# Patient Record
Sex: Male | Born: 1960 | Race: White | Hispanic: No | Marital: Married | Smoking: Never smoker
Health system: Southern US, Community
[De-identification: ages and names within clinical notes are randomized; demographics above are authoritative.]

## PROBLEM LIST (undated history)

## (undated) DIAGNOSIS — J189 Pneumonia, unspecified organism: Secondary | ICD-10-CM

## (undated) DIAGNOSIS — Z789 Other specified health status: Secondary | ICD-10-CM

## (undated) HISTORY — PX: COLONOSCOPY: SHX174

---

## 2019-07-26 ENCOUNTER — Other Ambulatory Visit: Payer: Self-pay

## 2019-07-26 ENCOUNTER — Inpatient Hospital Stay (HOSPITAL_COMMUNITY)
Admission: EM | Admit: 2019-07-26 | Discharge: 2019-07-30 | DRG: 470 | Disposition: A | Discharge status: Home Health | Payer: BC Managed Care – PPO | Attending: General Surgery | Admitting: General Surgery

## 2019-07-26 ENCOUNTER — Emergency Department (HOSPITAL_COMMUNITY): Payer: BC Managed Care – PPO

## 2019-07-26 DIAGNOSIS — Z23 Encounter for immunization: Secondary | ICD-10-CM

## 2019-07-26 DIAGNOSIS — E2749 Other adrenocortical insufficiency: Secondary | ICD-10-CM | POA: Diagnosis present

## 2019-07-26 DIAGNOSIS — S72002A Fracture of unspecified part of neck of left femur, initial encounter for closed fracture: Secondary | ICD-10-CM

## 2019-07-26 DIAGNOSIS — S42441A Displaced fracture (avulsion) of medial epicondyle of right humerus, initial encounter for closed fracture: Secondary | ICD-10-CM | POA: Diagnosis present

## 2019-07-26 DIAGNOSIS — S32000A Wedge compression fracture of unspecified lumbar vertebra, initial encounter for closed fracture: Secondary | ICD-10-CM

## 2019-07-26 DIAGNOSIS — J90 Pleural effusion, not elsewhere classified: Secondary | ICD-10-CM

## 2019-07-26 DIAGNOSIS — W14XXXA Fall from tree, initial encounter: Secondary | ICD-10-CM

## 2019-07-26 DIAGNOSIS — Z20828 Contact with and (suspected) exposure to other viral communicable diseases: Secondary | ICD-10-CM | POA: Diagnosis present

## 2019-07-26 DIAGNOSIS — Z96642 Presence of left artificial hip joint: Secondary | ICD-10-CM

## 2019-07-26 DIAGNOSIS — S32029A Unspecified fracture of second lumbar vertebra, initial encounter for closed fracture: Secondary | ICD-10-CM | POA: Diagnosis present

## 2019-07-26 DIAGNOSIS — S32019A Unspecified fracture of first lumbar vertebra, initial encounter for closed fracture: Secondary | ICD-10-CM | POA: Diagnosis present

## 2019-07-26 DIAGNOSIS — S32039A Unspecified fracture of third lumbar vertebra, initial encounter for closed fracture: Secondary | ICD-10-CM | POA: Diagnosis present

## 2019-07-26 DIAGNOSIS — S52041A Displaced fracture of coronoid process of right ulna, initial encounter for closed fracture: Secondary | ICD-10-CM | POA: Diagnosis present

## 2019-07-26 DIAGNOSIS — S53104A Unspecified dislocation of right ulnohumeral joint, initial encounter: Secondary | ICD-10-CM

## 2019-07-26 DIAGNOSIS — S52502A Unspecified fracture of the lower end of left radius, initial encounter for closed fracture: Secondary | ICD-10-CM | POA: Diagnosis present

## 2019-07-26 DIAGNOSIS — S2231XA Fracture of one rib, right side, initial encounter for closed fracture: Secondary | ICD-10-CM | POA: Diagnosis present

## 2019-07-26 DIAGNOSIS — S63005A Unspecified dislocation of left wrist and hand, initial encounter: Secondary | ICD-10-CM | POA: Diagnosis present

## 2019-07-26 DIAGNOSIS — S42401A Unspecified fracture of lower end of right humerus, initial encounter for closed fracture: Secondary | ICD-10-CM

## 2019-07-26 DIAGNOSIS — S32009A Unspecified fracture of unspecified lumbar vertebra, initial encounter for closed fracture: Secondary | ICD-10-CM | POA: Insufficient documentation

## 2019-07-26 DIAGNOSIS — W19XXXA Unspecified fall, initial encounter: Secondary | ICD-10-CM | POA: Diagnosis present

## 2019-07-26 DIAGNOSIS — S63095A Other dislocation of left wrist and hand, initial encounter: Secondary | ICD-10-CM

## 2019-07-26 DIAGNOSIS — T148XXA Other injury of unspecified body region, initial encounter: Secondary | ICD-10-CM

## 2019-07-26 HISTORY — DX: Other specified health status: Z78.9

## 2019-07-26 LAB — CBC
HCT: 48.6 % (ref 39.0–52.0)
Hemoglobin: 16.4 g/dL (ref 13.0–17.0)
MCH: 29 pg (ref 26.0–34.0)
MCHC: 33.7 g/dL (ref 30.0–36.0)
MCV: 86 fL (ref 80.0–100.0)
Platelets: 226 10*3/uL (ref 150–400)
RBC: 5.65 MIL/uL (ref 4.22–5.81)
RDW: 12.7 % (ref 11.5–15.5)
WBC: 21 10*3/uL — ABNORMAL HIGH (ref 4.0–10.5)
nRBC: 0 % (ref 0.0–0.2)

## 2019-07-26 LAB — COMPREHENSIVE METABOLIC PANEL
ALT: 111 U/L — ABNORMAL HIGH (ref 0–44)
AST: 106 U/L — ABNORMAL HIGH (ref 15–41)
Albumin: 3.9 g/dL (ref 3.5–5.0)
Alkaline Phosphatase: 56 U/L (ref 38–126)
Anion gap: 16 — ABNORMAL HIGH (ref 5–15)
BUN: 19 mg/dL (ref 6–20)
CO2: 15 mmol/L — ABNORMAL LOW (ref 22–32)
Calcium: 8.9 mg/dL (ref 8.9–10.3)
Chloride: 106 mmol/L (ref 98–111)
Creatinine, Ser: 1.26 mg/dL — ABNORMAL HIGH (ref 0.61–1.24)
GFR calc Af Amer: >60 mL/min (ref >60)
GFR calc non Af Amer: >60 mL/min (ref >60)
Glucose, Bld: 191 mg/dL — ABNORMAL HIGH (ref 70–99)
Potassium: 2.8 mmol/L — ABNORMAL LOW (ref 3.5–5.1)
Sodium: 137 mmol/L (ref 135–145)
Total Bilirubin: 0.7 mg/dL (ref 0.3–1.2)
Total Protein: 6.1 g/dL — ABNORMAL LOW (ref 6.5–8.1)

## 2019-07-26 LAB — I-STAT CHEM 8, ED
BUN: 19 mg/dL (ref 6–20)
Calcium, Ion: 1.17 mmol/L (ref 1.15–1.40)
Chloride: 108 mmol/L (ref 98–111)
Creatinine, Ser: 1.1 mg/dL (ref 0.61–1.24)
Glucose, Bld: 186 mg/dL — ABNORMAL HIGH (ref 70–99)
HCT: 47 % (ref 39.0–52.0)
Hemoglobin: 16 g/dL (ref 13.0–17.0)
Potassium: 2.9 mmol/L — ABNORMAL LOW (ref 3.5–5.1)
Sodium: 141 mmol/L (ref 135–145)
TCO2: 17 mmol/L — ABNORMAL LOW (ref 22–32)

## 2019-07-26 LAB — SAMPLE TO BLOOD BANK

## 2019-07-26 LAB — PROTIME-INR
INR: 1.1 (ref 0.8–1.2)
Prothrombin Time: 13.7 seconds (ref 11.4–15.2)

## 2019-07-26 LAB — SARS CORONAVIRUS 2 BY RT PCR (HOSPITAL ORDER, PERFORMED IN ~~LOC~~ HOSPITAL LAB): SARS Coronavirus 2: NEGATIVE

## 2019-07-26 LAB — ETHANOL: Alcohol, Ethyl (B): <10 mg/dL (ref <10)

## 2019-07-26 LAB — LACTIC ACID, PLASMA: Lactic Acid, Venous: 5.1 mmol/L (ref 0.5–1.9)

## 2019-07-26 LAB — CDS SEROLOGY

## 2019-07-26 MED ORDER — HYDROMORPHONE HCL 1 MG/ML IJ SOLN: 1.0000 mg | INTRAMUSCULAR | Status: DC | PRN

## 2019-07-26 MED ORDER — TETANUS-DIPHTH-ACELL PERTUSSIS 5-2.5-18.5 LF-MCG/0.5 IM SUSP
0.5000 mL | Freq: Once | INTRAMUSCULAR | Status: AC
  Administered 2019-07-26: 0.5 mL via INTRAMUSCULAR
  Filled 2019-07-26: qty 0.5

## 2019-07-26 MED ORDER — POTASSIUM CHLORIDE 10 MEQ/100ML IV SOLN
10.0000 meq | Freq: Once | INTRAVENOUS | Status: AC
  Administered 2019-07-26: 22:00:00 10 meq via INTRAVENOUS
  Filled 2019-07-26: qty 100

## 2019-07-26 MED ORDER — PROPOFOL 10 MG/ML IV BOLUS
200.0000 mg | Freq: Once | INTRAVENOUS | Status: AC
  Administered 2019-07-26: 160 mg via INTRAVENOUS
  Filled 2019-07-26: qty 20

## 2019-07-26 MED ORDER — ONDANSETRON 4 MG PO TBDP: 4.0000 mg | ORAL_TABLET | Freq: Four times a day (QID) | ORAL | Status: DC | PRN

## 2019-07-26 MED ORDER — PANTOPRAZOLE SODIUM 40 MG IV SOLR: 40.0000 mg | Freq: Every day | INTRAVENOUS | Status: DC

## 2019-07-26 MED ORDER — HYDROMORPHONE HCL 1 MG/ML IJ SOLN
1.0000 mg | Freq: Once | INTRAMUSCULAR | Status: AC
  Administered 2019-07-26: 22:00:00 1 mg via INTRAVENOUS
  Filled 2019-07-26: qty 1

## 2019-07-26 MED ORDER — PANTOPRAZOLE SODIUM 40 MG PO TBEC
40.0000 mg | DELAYED_RELEASE_TABLET | Freq: Every day | ORAL | Status: DC
  Administered 2019-07-27 – 2019-07-30 (×4): 40 mg via ORAL
  Filled 2019-07-26 (×4): qty 1

## 2019-07-26 MED ORDER — LIDOCAINE HCL 2 % IJ SOLN
10.0000 mL | Freq: Once | INTRAMUSCULAR | Status: DC
  Filled 2019-07-26: qty 20

## 2019-07-26 MED ORDER — SODIUM CHLORIDE 0.9 % IV BOLUS
1000.0000 mL | Freq: Once | INTRAVENOUS | Status: AC
  Administered 2019-07-26: 1000 mL via INTRAVENOUS

## 2019-07-26 MED ORDER — HYDROMORPHONE HCL 1 MG/ML IJ SOLN
1.0000 mg | Freq: Once | INTRAMUSCULAR | Status: AC
  Administered 2019-07-26: 1 mg via INTRAVENOUS
  Filled 2019-07-26: qty 1

## 2019-07-26 MED ORDER — MORPHINE SULFATE (PF) 4 MG/ML IV SOLN
4.0000 mg | Freq: Once | INTRAVENOUS | Status: AC
  Administered 2019-07-26: 4 mg via INTRAVENOUS
  Filled 2019-07-26: qty 1

## 2019-07-26 MED ORDER — ENOXAPARIN SODIUM 40 MG/0.4ML ~~LOC~~ SOLN
40.0000 mg | SUBCUTANEOUS | Status: AC
  Administered 2019-07-27: 40 mg via SUBCUTANEOUS
  Filled 2019-07-26: qty 0.4

## 2019-07-26 MED ORDER — SODIUM CHLORIDE 0.9 % IV BOLUS
1000.0000 mL | Freq: Once | INTRAVENOUS | Status: AC
  Administered 2019-07-26: 20:00:00 1000 mL via INTRAVENOUS

## 2019-07-26 MED ORDER — PROPOFOL 10 MG/ML IV BOLUS
INTRAVENOUS | Status: AC | PRN
  Administered 2019-07-26 (×3): 30 ug via INTRAVENOUS
  Administered 2019-07-26: 50 ug via INTRAVENOUS
  Administered 2019-07-26: 20 ug via INTRAVENOUS

## 2019-07-26 MED ORDER — ONDANSETRON HCL 4 MG/2ML IJ SOLN
4.0000 mg | Freq: Once | INTRAMUSCULAR | Status: AC
  Administered 2019-07-26: 4 mg via INTRAVENOUS
  Filled 2019-07-26: qty 2

## 2019-07-26 MED ORDER — OXYCODONE HCL 5 MG PO TABS: 10.0000 mg | ORAL_TABLET | ORAL | Status: DC | PRN

## 2019-07-26 MED ORDER — ONDANSETRON HCL 4 MG/2ML IJ SOLN: 4.0000 mg | Freq: Four times a day (QID) | INTRAMUSCULAR | Status: DC | PRN

## 2019-07-26 MED ORDER — IOHEXOL 300 MG/ML  SOLN
100.0000 mL | Freq: Once | INTRAMUSCULAR | Status: AC | PRN
  Administered 2019-07-26: 100 mL via INTRAVENOUS

## 2019-07-26 NOTE — ED Triage Notes (Addendum)
Patient brought in by [facility] due falling out of tree. Patient was in tree cutting a branch when ladder fell from under him and he fell 87ft out of tree. Denies LOC. Wife witnessed fall and call EMS  Obvious deformity to L. Wrist, R. Elbow, shortening and deformity to LLE,   Pain to the upper back and R. Shoulder. Rating pain a 10/10  IV X2 Left AC. 18gauges  vss

## 2019-07-26 NOTE — Sedation Documentation (Signed)
Unable to q5 minute vitals due patient having right shoulder reduction and left wrist reduction

## 2019-07-26 NOTE — Sedation Documentation (Addendum)
Right Shoulder reduced by Dr. Doreatha Martin and Patrecia Pace, PA.  Splint placed on right arm by ortho tech.

## 2019-07-26 NOTE — ED Notes (Signed)
Pt in x-ray at this time

## 2019-07-26 NOTE — H&P (Signed)
History   Robert Hahn is an 58 y.o. male.   Chief Complaint:  Chief Complaint  Patient presents with   Fall    [facility]   Trauma    Patient is a 58 year old male with no past medical history, who comes in after falling from a ladder, 20 feet.  According to the patient's daughter he was cutting a limb when the limb fell and hit the ladder.  Patient landed on his hands.  Patient denies any LOC.  Patient was coded as a level 2 trauma on intake. Patient underwent ATLS work-up.  Patient was found to have right elbow dislocation, left wrist dislocation, lumbar fractures, adrenal hemorrhage, and right hip fracture.  Trauma surgery was consulted for admission.  Orthopedic surgery was consulted for fractures dislocations.  Neurosurgery was consulted for lumbar fractures.  Urology was consulted for adrenal hemorrhage.   No past medical history on file.   No family history on file. Social History:  has no history on file for tobacco, alcohol, and drug.  Allergies  No Known Allergies  Home Medications  (Not in a hospital admission)   Trauma Course   Results for orders placed or performed during the hospital encounter of 07/26/19 (from the past 48 hour(s))  CDS serology     Status: None   Collection Time: 07/26/19  8:01 PM  Result Value Ref Range   CDS serology specimen      SPECIMEN WILL BE HELD FOR 14 DAYS IF TESTING IS REQUIRED    Comment: Performed at [facility], [address] [place], [place] [address]  Comprehensive metabolic panel     Status: Abnormal   Collection Time: 07/26/19  8:01 PM  Result Value Ref Range   Sodium 137 135 - 145 mmol/L   Potassium 2.8 (L) 3.5 - 5.1 mmol/L   Chloride 106 98 - 111 mmol/L   CO2 15 (L) 22 - 32 mmol/L   Glucose, Bld 191 (H) 70 - 99 mg/dL   BUN 19 6 - 20 mg/dL   Creatinine, Ser 6.041.26 (H) 0.61 - 1.24 mg/dL   Calcium 8.9 8.9 - 54.010.3 mg/dL   Total Protein 6.1 (L) 6.5 - 8.1 g/dL   Albumin 3.9 3.5 - 5.0 g/dL   AST  981106 (H) 15 - 41 U/L   ALT 111 (H) 0 - 44 U/L   Alkaline Phosphatase 56 38 - 126 U/L   Total Bilirubin 0.7 0.3 - 1.2 mg/dL   GFR calc non Af Amer >60 >60 mL/min   GFR calc Af Amer >60 >60 mL/min   Anion gap 16 (H) 5 - 15    Comment: Performed at [facility], [address] [place], [place] [address]  CBC     Status: Abnormal   Collection Time: 07/26/19  8:01 PM  Result Value Ref Range   WBC 21.0 (H) 4.0 - 10.5 K/uL   RBC 5.65 4.22 - 5.81 MIL/uL   Hemoglobin 16.4 13.0 - 17.0 g/dL   HCT 82.948.6 56.239.0 - 13.052.0 %   MCV 86.0 80.0 - 100.0 fL   MCH 29.0 26.0 - 34.0 pg   MCHC 33.7 30.0 - 36.0 g/dL   RDW 86.512.7 78.411.5 - 69.615.5 %   Platelets 226 150 - 400 K/uL   nRBC 0.0 0.0 - 0.2 %    Comment: Performed at [facility], [address] [place], [place] [address]  Ethanol     Status: None   Collection  Time: 07/26/19  8:01 PM  Result Value Ref Range   Alcohol, Ethyl (B) <10 <10 mg/dL    Comment: (NOTE) Lowest detectable limit for serum alcohol is 10 mg/dL. For medical purposes only. Performed at [facility], [address] [place], [place] [address]   Lactic acid, plasma     Status: Abnormal   Collection Time: 07/26/19  8:01 PM  Result Value Ref Range   Lactic Acid, Venous 5.1 (HH) 0.5 - 1.9 mmol/L    Comment: CRITICAL RESULT CALLED TO, READ BACK BY AND VERIFIED WITH: Marissa Nestle RN @ 2044 ON 07/26/2019 BY TEMOCHE, H Performed at [facility], [address] [place], [place] [address]   Protime-INR     Status: None   Collection Time: 07/26/19  8:01 PM  Result Value Ref Range   Prothrombin Time 13.7 11.4 - 15.2 seconds   INR 1.1 0.8 - 1.2    Comment: (NOTE) INR goal varies based on device and disease states. Performed at [facility], [address] [place], [place] [address]   Sample to Blood Bank     Status: None   Collection Time: 07/26/19  8:01 PM  Result Value Ref Range   Blood Bank Specimen SAMPLE AVAILABLE FOR TESTING    Sample Expiration       07/27/2019,2359 Performed at [facility], [address], [place], [place] [address]   I-stat chem 8, ED     Status: Abnormal   Collection Time: 07/26/19  8:07 PM  Result Value Ref Range   Sodium 141 135 - 145 mmol/L   Potassium 2.9 (L) 3.5 - 5.1 mmol/L   Chloride 108 98 - 111 mmol/L   BUN 19 6 - 20 mg/dL   Creatinine, Ser 5.28 0.61 - 1.24 mg/dL   Glucose, Bld 413 (H) 70 - 99 mg/dL   Calcium, Ion 2.44 0.10 - 1.40 mmol/L   TCO2 17 (L) 22 - 32 mmol/L   Hemoglobin 16.0 13.0 - 17.0 g/dL   HCT 27.2 53.6 - 64.4 %  SARS Coronavirus 2 [facility] order, Performed in [facility] lab) Nasopharyngeal Nasopharyngeal Swab     Status: None   Collection Time: 07/26/19  8:12 PM   Specimen: Nasopharyngeal Swab  Result Value Ref Range   SARS Coronavirus 2 NEGATIVE NEGATIVE    Comment: (NOTE) If result is NEGATIVE SARS-CoV-2 target nucleic acids are NOT DETECTED. The SARS-CoV-2 RNA is generally detectable in upper and lower  respiratory specimens during the acute phase of infection. The lowest  concentration of SARS-CoV-2 viral copies this assay can detect is 250  copies / mL. A negative result does not preclude SARS-CoV-2 infection  and should not be used as the sole basis for treatment or other  patient management decisions.  A negative result may occur with  improper specimen collection / handling, submission of specimen other  than nasopharyngeal swab, presence of viral mutation(s) within the  areas targeted by this assay, and inadequate number of viral copies  (<250 copies / mL). A negative result must be combined with clinical  observations, patient history, and epidemiological information. If result is POSITIVE SARS-CoV-2 target nucleic acids are DETECTED. The SARS-CoV-2 RNA is generally detectable in upper and lower  respiratory specimens dur ing the acute phase of infection.  Positive  results are indicative of active infection with SARS-CoV-2.  Clinical  correlation with  patient history and other diagnostic information is  necessary to determine patient infection  status.  Positive results do  not rule out bacterial infection or co-infection with other viruses. If result is PRESUMPTIVE POSTIVE SARS-CoV-2 nucleic acids MAY BE PRESENT.   A presumptive positive result was obtained on the submitted specimen  and confirmed on repeat testing.  While 2019 novel coronavirus  (SARS-CoV-2) nucleic acids may be present in the submitted sample  additional confirmatory testing may be necessary for epidemiological  and / or clinical management purposes  to differentiate between  SARS-CoV-2 and other Sarbecovirus currently known to infect humans.  If clinically indicated additional testing with an alternate test  methodology [phone]) is advised. The SARS-CoV-2 RNA is generally  detectable in upper and lower respiratory sp ecimens during the acute  phase of infection. The expected result is Negative. Fact Sheet for Patients:  BoilerBrush.com.cy Fact Sheet for Healthcare Providers: https://pope.com/ This test is not yet approved or cleared by the [place] FDA and has been authorized for detection and/or diagnosis of SARS-CoV-2 by FDA under an Emergency Use Authorization (EUA).  This EUA will remain in effect (meaning this test can be used) for the duration of the COVID-19 declaration under Section 564(b)(1) of the Act, 21 U.S.C. section 360bbb-3(b)(1), unless the authorization is terminated or revoked sooner. Performed at [facility], [address], [place], [place] [address]    Dg Shoulder Right  Result Date: 07/26/2019 CLINICAL DATA:  Right shoulder pain EXAM: RIGHT SHOULDER - 2+ VIEW COMPARISON:  None. FINDINGS: There is no evidence of fracture or dislocation. Visualized portion of the lung is clear. Soft tissues are unremarkable. IMPRESSION: No acute osseous abnormality. Electronically Signed   By: Jonna Clark M.D.   On: 07/26/2019 21:56   Dg Elbow Complete Right  Result Date: 07/26/2019 CLINICAL DATA:  Pain after fall EXAM: RIGHT ELBOW - COMPLETE 3+ VIEW COMPARISON:  None. FINDINGS: There is a 1.1 cm osseous fragment seen adjacent to the medial epicondyle, likely fracture from the medial epicondyle. There is complete posterior dislocation of the radius and ulna at the elbow. There is also a tiny osseous fragment seen adjacent to the capitellar surface of indeterminate origin. Significant surrounding soft tissue swelling and elbow joint effusion is seen. IMPRESSION: Osseous fragment adjacent to the medial epicondyle, likely medial epicondylar fracture. Complete posterior dislocation at the elbow. The osseous fragment seen adjacent to the capitellum of indeterminate origin. Significant surrounding soft tissue swelling and elbow joint effusion. Electronically Signed   By: Jonna Clark M.D.   On: 07/26/2019 21:59   Dg Wrist Complete Left  Result Date: 07/26/2019 CLINICAL DATA:  Fall EXAM: LEFT WRIST - COMPLETE 3+ VIEW COMPARISON:  None. FINDINGS: There is a complete anterior volar dislocation of the lunate. There is superior migration of the radius with volar angulation. There is comminuted fracture seen of the distal radius and ulnar styloid with small fracture fragments seen along the radial surface. There is ulnar subluxation of the distal ulna. Significant surrounding soft tissue swelling is seen. There is a mild metallic foreign body seen overlying the fifth metacarpal head. IMPRESSION: Complete anterior volar dislocation of the lunate. Comminuted fracture of the distal radius with superior impaction of the distal radius. Electronically Signed   By: Jonna Clark M.D.   On: 07/26/2019 21:55   Ct Head Wo Contrast  Result Date: 07/26/2019 CLINICAL DATA:  Fall from tree. EXAM: CT HEAD WITHOUT CONTRAST CT CERVICAL SPINE WITHOUT CONTRAST TECHNIQUE: Multidetector CT imaging of the head and cervical spine was  performed following the standard  protocol without intravenous contrast. Multiplanar CT image reconstructions of the cervical spine were also generated. COMPARISON:  None. FINDINGS: CT HEAD FINDINGS Brain: No acute intracranial abnormality. Specifically, no hemorrhage, hydrocephalus, mass lesion, acute infarction, or significant intracranial injury. Vascular: No hyperdense vessel or unexpected calcification. Skull: No acute calvarial abnormality. Sinuses/Orbits: Visualized paranasal sinuses and mastoids clear. Orbital soft tissues unremarkable. Other: None CT CERVICAL SPINE FINDINGS Alignment: Normal Skull base and vertebrae: No acute fracture. No primary bone lesion or focal pathologic process. Soft tissues and spinal canal: No prevertebral fluid or swelling. No visible canal hematoma. Disc levels: Degenerative disc disease at C4-5 with disc space narrowing and spurring. Upper chest: Posterior right 1st rib fracture noted. There appears to be a right effusion partially imaged. See chest CT report for full discussion. Other: None IMPRESSION: No acute intracranial abnormality. Fracture through the posterior right first rib. Probable right pleural effusion. No acute bony abnormality in the cervical spine. Electronically Signed   By: Charlett NoseKevin  Dover M.D.   On: 07/26/2019 21:05   Ct Chest W Contrast  Addendum Date: 07/26/2019   ADDENDUM REPORT: 07/26/2019 21:27 ADDENDUM: A minimally displaced proximal right first rib fracture is present. No additional rib fractures are present. Electronically Signed   By: Marin Robertshristopher  Mattern M.D.   On: 07/26/2019 21:27   Result Date: 07/26/2019 CLINICAL DATA:  Abd trauma, blunt, stable. Hit by tree branch. Fell from tree. EXAM: CT CHEST, ABDOMEN, AND PELVIS WITH CONTRAST TECHNIQUE: Multidetector CT imaging of the chest, abdomen and pelvis was performed following the standard protocol during bolus administration of intravenous contrast. CONTRAST:  100mL OMNIPAQUE IOHEXOL 300 MG/ML  SOLN  COMPARISON:  None. FINDINGS: CT CHEST FINDINGS Cardiovascular: Heart size is normal. In aorta and great vessel origins are within normal limits. Pulmonary arteries are normal. There is no significant pericardial effusion or trauma. Mediastinum/Nodes: Significant mediastinal, hilar, or axillary adenopathy is present. There is no significant mediastinal effusion or air. Lungs/Pleura: Minimal dependent atelectasis is present. Lungs are otherwise clear without focal nodule, mass, or airspace disease. Is no pulmonary contusion. No pneumothorax is present. No significant pleural effusions are present. Musculoskeletal: 12 rib-bearing thoracic type vertebral bodies are present. No acute or healing fractures are present. Ribs are unremarkable. CT ABDOMEN PELVIS FINDINGS Hepatobiliary: Hemorrhage is present along the posterior margin of the liver. No focal hepatic contusion is present. The common bile duct and gallbladder normal. Pancreas: Unremarkable. No pancreatic ductal dilatation or surrounding inflammatory changes. Spleen: No splenic injury or perisplenic hematoma. Adrenals/Urinary Tract: There is diffuse hemorrhage about the right adrenal gland. Focal collection is present. Left adrenal gland is normal. Simple cyst in the left kidney measures 5.3 x 4.1 x 4.3 cm. Kidneys and ureters are otherwise within normal limits bilaterally. The urinary bladder is within normal limits. Stomach/Bowel: Stomach and duodenum are within normal limits. Small bowel is unremarkable. Terminal ileum is normal. The appendix is visualized and normal. The ascending and transverse colon are normal. Descending and sigmoid colon are within normal limits. Vascular/Lymphatic: Scattered atherosclerotic calcifications are present. There is no aneurysm. No significant retroperitoneal adenopathy is present. Reproductive: Prostate is unremarkable. Other: No abdominal wall hernia or abnormality. No abdominopelvic ascites. Musculoskeletal: Left transverse  process fractures are present at L1, L2, and L3. Fracture fragments are displaced posteriorly 1 shaft width. Vertebral body heights are maintained. No additional fractures present in the spine. Sacrum is intact. A comminuted left femoral neck fracture is present. Varus angulation is noted. Right hip is located. Left acetabulum is intact.  IMPRESSION: 1. Right adrenal hemorrhage. 2. Hemorrhage extending into Morrison's pouch and about the free edge of the liver posteriorly. No definite hemorrhagic contusion is present. 3. Left L1, L2, and L3 transverse process fractures 4. Comminuted left femoral neck fracture. Electronically Signed: By: Marin Roberts M.D. On: 07/26/2019 21:18   Ct Cervical Spine Wo Contrast  Result Date: 07/26/2019 CLINICAL DATA:  Fall from tree. EXAM: CT HEAD WITHOUT CONTRAST CT CERVICAL SPINE WITHOUT CONTRAST TECHNIQUE: Multidetector CT imaging of the head and cervical spine was performed following the standard protocol without intravenous contrast. Multiplanar CT image reconstructions of the cervical spine were also generated. COMPARISON:  None. FINDINGS: CT HEAD FINDINGS Brain: No acute intracranial abnormality. Specifically, no hemorrhage, hydrocephalus, mass lesion, acute infarction, or significant intracranial injury. Vascular: No hyperdense vessel or unexpected calcification. Skull: No acute calvarial abnormality. Sinuses/Orbits: Visualized paranasal sinuses and mastoids clear. Orbital soft tissues unremarkable. Other: None CT CERVICAL SPINE FINDINGS Alignment: Normal Skull base and vertebrae: No acute fracture. No primary bone lesion or focal pathologic process. Soft tissues and spinal canal: No prevertebral fluid or swelling. No visible canal hematoma. Disc levels: Degenerative disc disease at C4-5 with disc space narrowing and spurring. Upper chest: Posterior right 1st rib fracture noted. There appears to be a right effusion partially imaged. See chest CT report for full  discussion. Other: None IMPRESSION: No acute intracranial abnormality. Fracture through the posterior right first rib. Probable right pleural effusion. No acute bony abnormality in the cervical spine. Electronically Signed   By: Charlett Nose M.D.   On: 07/26/2019 21:05   Ct Abdomen Pelvis W Contrast  Addendum Date: 07/26/2019   ADDENDUM REPORT: 07/26/2019 21:27 ADDENDUM: A minimally displaced proximal right first rib fracture is present. No additional rib fractures are present. Electronically Signed   By: Marin Roberts M.D.   On: 07/26/2019 21:27   Result Date: 07/26/2019 CLINICAL DATA:  Abd trauma, blunt, stable. Hit by tree branch. Fell from tree. EXAM: CT CHEST, ABDOMEN, AND PELVIS WITH CONTRAST TECHNIQUE: Multidetector CT imaging of the chest, abdomen and pelvis was performed following the standard protocol during bolus administration of intravenous contrast. CONTRAST:  OMNIPAQUE IOHEXOL 300 MG/ML  SOLN COMPARISON:  None. FINDINGS: CT CHEST FINDINGS Cardiovascular: Heart size is normal. In aorta and great vessel origins are within normal limits. Pulmonary arteries are normal. There is no significant pericardial effusion or trauma. Mediastinum/Nodes: Significant mediastinal, hilar, or axillary adenopathy is present. There is no significant mediastinal effusion or air. Lungs/Pleura: Minimal dependent atelectasis is present. Lungs are otherwise clear without focal nodule, mass, or airspace disease. Is no pulmonary contusion. No pneumothorax is present. No significant pleural effusions are present. Musculoskeletal: 12 rib-bearing thoracic type vertebral bodies are present. No acute or healing fractures are present. Ribs are unremarkable. CT ABDOMEN PELVIS FINDINGS Hepatobiliary: Hemorrhage is present along the posterior margin of the liver. No focal hepatic contusion is present. The common bile duct and gallbladder normal. Pancreas: Unremarkable. No pancreatic ductal dilatation or surrounding  inflammatory changes. Spleen: No splenic injury or perisplenic hematoma. Adrenals/Urinary Tract: There is diffuse hemorrhage about the right adrenal gland. Focal collection is present. Left adrenal gland is normal. Simple cyst in the left kidney measures 5.3 x 4.1 x 4.3 cm. Kidneys and ureters are otherwise within normal limits bilaterally. The urinary bladder is within normal limits. Stomach/Bowel: Stomach and duodenum are within normal limits. Small bowel is unremarkable. Terminal ileum is normal. The appendix is visualized and normal. The ascending and transverse colon are  normal. Descending and sigmoid colon are within normal limits. Vascular/Lymphatic: Scattered atherosclerotic calcifications are present. There is no aneurysm. No significant retroperitoneal adenopathy is present. Reproductive: Prostate is unremarkable. Other: No abdominal wall hernia or abnormality. No abdominopelvic ascites. Musculoskeletal: Left transverse process fractures are present at L1, L2, and L3. Fracture fragments are displaced posteriorly 1 shaft width. Vertebral body heights are maintained. No additional fractures present in the spine. Sacrum is intact. A comminuted left femoral neck fracture is present. Varus angulation is noted. Right hip is located. Left acetabulum is intact. IMPRESSION: 1. Right adrenal hemorrhage. 2. Hemorrhage extending into Morrison's pouch and about the free edge of the liver posteriorly. No definite hemorrhagic contusion is present. 3. Left L1, L2, and L3 transverse process fractures 4. Comminuted left femoral neck fracture. Electronically Signed: By: Marin Roberts M.D. On: 07/26/2019 21:18   Dg Chest Port 1 View  Result Date: 07/26/2019 CLINICAL DATA:  Fall out of tree. EXAM: PORTABLE CHEST 1 VIEW COMPARISON:  None. FINDINGS: The heart size and mediastinal contours are within normal limits. Both lungs are clear. The visualized skeletal structures are unremarkable. IMPRESSION: No active disease.  Electronically Signed   By: Charlett Nose M.D.   On: 07/26/2019 20:42   Dg Knee Complete 4 Views Left  Result Date: 07/26/2019 CLINICAL DATA:  Knee pain after fall EXAM: LEFT KNEE - COMPLETE 4+ VIEW COMPARISON:  None. FINDINGS: No fracture or dislocation. Small knee joint effusion is seen. Soft tissue swelling seen around the medial aspect of the knee. The linear calcifications seen in the medial compartment of the knee. IMPRESSION: No acute osseous abnormality. Soft tissue swelling seen around the medial knee with a small knee joint effusion. 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 Electronically Signed   By: Jonna Clark M.D.   On: 07/26/2019 21:57   Dg Hip Port Unilat With Pelvis 1v Left  Result Date: 07/26/2019 CLINICAL DATA:  Left hip pain following a fall out of a tree. EXAM: DG HIP (WITH OR WITHOUT PELVIS) 1V PORT LEFT COMPARISON:  None. FINDINGS: Left femoral neck fracture with mild proximal displacement of the distal fragment and mild varus angulation. No other fractures seen. IMPRESSION: Left femoral neck fracture, as described above. Electronically Signed   By: Beckie Salts M.D.   On: 07/26/2019 20:41    Review of Systems  Constitutional: Negative for chills, fever, malaise/fatigue and weight loss.  HENT: Negative for ear discharge, ear pain, hearing loss, sore throat and tinnitus.   Eyes: Negative for blurred vision, double vision, photophobia, pain and discharge.  Respiratory: Negative for cough, sputum production and shortness of breath.   Cardiovascular: Negative for chest pain, orthopnea and leg swelling.  Gastrointestinal: Negative for abdominal pain, constipation, diarrhea, heartburn, nausea and vomiting.  Genitourinary: Negative for dysuria, flank pain, frequency and urgency.  Musculoskeletal: Negative for back pain, falls, joint pain, myalgias and neck pain.  Skin: Negative for itching and rash.  Neurological: Negative for dizziness, tingling,  sensory change, focal weakness, seizures, loss of consciousness and headaches.  Endo/Heme/Allergies: Negative for environmental allergies. Does not bruise/bleed easily.  Psychiatric/Behavioral: Negative for depression, memory loss, substance abuse and suicidal ideas. The patient is not nervous/anxious.   All other systems reviewed and are negative.   Blood pressure (!) 150/86, pulse (!) 102, temperature 98.1 F (36.7 C), temperature source Oral, resp. rate (!) 25, height 5\' 10"  (1.778 m), weight 83 kg, SpO2 99 %. Physical Exam  Vitals reviewed. Constitutional: He is  oriented to person, place, and time. He appears well-developed and well-nourished. He is cooperative. No distress. Cervical collar and nasal cannula in place.  HENT:  Head: Normocephalic and atraumatic. Head is without raccoon's eyes, without Battle's sign, without abrasion, without contusion and without laceration.  Right Ear: Hearing, tympanic membrane, external ear and ear canal normal. No lacerations. No drainage or tenderness. No foreign bodies. Tympanic membrane is not perforated. No hemotympanum.  Left Ear: Hearing, tympanic membrane, external ear and ear canal normal. No lacerations. No drainage or tenderness. No foreign bodies. Tympanic membrane is not perforated. No hemotympanum.  Nose: Nose normal. No nose lacerations, sinus tenderness, nasal deformity or nasal septal hematoma. No epistaxis.  Mouth/Throat: Uvula is midline, oropharynx is clear and moist and mucous membranes are normal. No lacerations.  Eyes: Pupils are equal, round, and reactive to light. Conjunctivae, EOM and lids are normal. No scleral icterus.  Neck: Trachea normal. No JVD present. No spinous process tenderness and no muscular tenderness present. Carotid bruit is not present. No thyromegaly present.  Cardiovascular: Normal rate, regular rhythm, normal heart sounds, intact distal pulses and normal pulses.  Respiratory: Effort normal and breath sounds  normal. No respiratory distress. He exhibits no tenderness, no bony tenderness, no laceration and no crepitus.  GI: Soft. Normal appearance. He exhibits no distension. Bowel sounds are decreased. There is no abdominal tenderness. There is no rigidity, no rebound, no guarding and no CVA tenderness.    Musculoskeletal: Normal range of motion.        General: No tenderness or edema.     Right elbow: He exhibits deformity.     Left wrist: He exhibits deformity.     Right hip: He exhibits deformity.  Lymphadenopathy:    He has no cervical adenopathy.  Neurological: He is alert and oriented to person, place, and time. He has normal strength. No cranial nerve deficit or sensory deficit. GCS eye subscore is 4. GCS verbal subscore is 5. GCS motor subscore is 6.  Skin: Skin is warm, dry and intact. He is not diaphoretic.  Psychiatric: He has a normal mood and affect. His speech is normal and behavior is normal.     Assessment/Plan 58 year old male status post fall, 20 feet Right elbow dislocation Left wrist dislocation Lumbar transverse process fractures Right hip fracture Adrenal hemorrhage  1.  We will admit the patient to the floor, pain control, okay for p.o. 2.  Dr. Doreatha Martin is seen the patient and underwent closed reduction of right elbow and left wrist.  Patient likely to the OR Tuesday. 2.  Dr. Arnoldo Morale was consulted for lumbar transverse process fractures, recommends pain control. 3.  Dr. Jeffie Pollock of urology was consulted for adrenal hemorrhage.  No need for emergent/urgent treatment at this time.  We will continue to watch hemoglobin.  Ralene Ok 07/26/2019, 11:05 PM   Procedures

## 2019-07-26 NOTE — Progress Notes (Signed)
   07/26/19 2000  Clinical Encounter Type  Visited With Other (Comment) (ED secretary)  Visit Type Initial;ED;Trauma  Referral From Other (Comment) (level 2 trauma pg)   Per covid procedure, called ED at 7:50, then f/u call at 8:07p.  Chaplain presence not needed at this time.  Pls pg if changes.  [place], [phone]

## 2019-07-26 NOTE — ED Notes (Addendum)
Patient to CT.

## 2019-07-26 NOTE — ED Provider Notes (Signed)
[facility] EMERGENCY DEPARTMENT Provider Note   CSN: [id number] Arrival date & time: 07/26/19  1948     History   Chief Complaint Chief Complaint  Patient presents with   Fall    20FT Fall From [facility]   Trauma    HPI Robert Hahn is a 58 y.o. male also healthy here presenting with fall.  Patient was on top of a 20 foot ladder and was trying to cut a tree and accidentally fell down.  He states that he fell on bilateral arms and right shoulder.  EMS was called and he was noted to have a left hip deformity as well as right elbow deformity.  He was also noted to have decreased breath sounds on the right chest and bruising on the right abdomen.  Due to significant trauma mechanism and tachycardia, level 2 trauma was activated in triage      The history is provided by the patient.  Fall  Trauma Mechanism of injury: fall    No past medical history on file.  Patient Active Problem List   Diagnosis Date Noted   Fall 07/26/2019        Home Medications    Prior to Admission medications   Medication Sig Start Date End Date Taking? Authorizing Provider  loratadine (CLARITIN) 10 MG tablet Take 5 mg by mouth daily.   Yes [provider]    Family History No family history on file.  Social History Social History   Tobacco Use   Smoking status: Not on file  Substance Use Topics   Alcohol use: Not on file   Drug use: Not on file     Allergies   Patient has no known allergies.   Review of Systems Review of Systems  Musculoskeletal:       R elbow, L wrist, L hip deformity. R shoulder abrasion  All other systems reviewed and are negative.    Physical Exam Updated Vital Signs BP (!) 150/86    Pulse (!) 102    Temp 98.1 F (36.7 C) (Oral)    Resp (!) 25    Ht 5\' 10"  (1.778 m)    Wt 83 kg    SpO2 99%    BMI 26.26 kg/m   Physical Exam Vitals signs and nursing note reviewed.  Constitutional:      Comments: Uncomfortable     HENT:     Head:     Comments: No obvious scalp hematoma     Nose: Nose normal.     Mouth/Throat:     Mouth: Mucous membranes are moist.  Eyes:     Extraocular Movements: Extraocular movements intact.     Pupils: Pupils are equal, round, and reactive to light.  Neck:     Comments: C collar in place  Cardiovascular:     Rate and Rhythm: Regular rhythm. Tachycardia present.     Pulses: Normal pulses.  Pulmonary:     Effort: Pulmonary effort is normal.     Comments: Decreased breath sounds R chest.  Abdominal:     Comments: Bruising R abdomen, mildly tender   Musculoskeletal:     Comments: R elbow deformity, L wrist deformity. Obvious L hip deformity with shortening and rotation. Good peripheral pulses throughout. No midline spinal tenderness   Skin:    General: Skin is warm.     Capillary Refill: Capillary refill takes less than 2 seconds.  Neurological:     General: No focal deficit present.  Mental Status: He is alert and oriented to person, place, and time.  Psychiatric:        Mood and Affect: Mood normal.      ED Treatments / Results  Labs (all labs ordered are listed, but only abnormal results are displayed) Labs Reviewed  COMPREHENSIVE METABOLIC PANEL - Abnormal; Notable for the following components:      Result Value   Potassium 2.8 (*)    CO2 15 (*)    Glucose, Bld 191 (*)    Creatinine, Ser 1.26 (*)    Total Protein 6.1 (*)    AST 106 (*)    ALT 111 (*)    Anion gap 16 (*)    All other components within normal limits  CBC - Abnormal; Notable for the following components:   WBC 21.0 (*)    All other components within normal limits  LACTIC ACID, PLASMA - Abnormal; Notable for the following components:   Lactic Acid, Venous 5.1 (*)    All other components within normal limits  I-STAT CHEM 8, ED - Abnormal; Notable for the following components:   Potassium 2.9 (*)    Glucose, Bld 186 (*)    TCO2 17 (*)    All other components within normal limits   SARS CORONAVIRUS 2 (HOSPITAL ORDER, PERFORMED IN [facility])  CDS SEROLOGY  ETHANOL  PROTIME-INR  URINALYSIS, ROUTINE W REFLEX MICROSCOPIC  HIV ANTIBODY (ROUTINE TESTING W REFLEX)  CBC  BASIC METABOLIC PANEL  SAMPLE TO BLOOD BANK    EKG None  Radiology Dg Shoulder Right  Result Date: 07/26/2019 CLINICAL DATA:  Right shoulder pain EXAM: RIGHT SHOULDER - 2+ VIEW COMPARISON:  None. FINDINGS: There is no evidence of fracture or dislocation. Visualized portion of the lung is clear. Soft tissues are unremarkable. IMPRESSION: No acute osseous abnormality. Electronically Signed   By: Jonna Clark M.D.   On: 07/26/2019 21:56   Dg Elbow Complete Right  Result Date: 07/26/2019 CLINICAL DATA:  Pain after fall EXAM: RIGHT ELBOW - COMPLETE 3+ VIEW COMPARISON:  None. FINDINGS: There is a 1.1 cm osseous fragment seen adjacent to the medial epicondyle, likely fracture from the medial epicondyle. There is complete posterior dislocation of the radius and ulna at the elbow. There is also a tiny osseous fragment seen adjacent to the capitellar surface of indeterminate origin. Significant surrounding soft tissue swelling and elbow joint effusion is seen. IMPRESSION: Osseous fragment adjacent to the medial epicondyle, likely medial epicondylar fracture. Complete posterior dislocation at the elbow. The osseous fragment seen adjacent to the capitellum of indeterminate origin. Significant surrounding soft tissue swelling and elbow joint effusion. Electronically Signed   By: Jonna Clark M.D.   On: 07/26/2019 21:59   Dg Wrist Complete Left  Result Date: 07/26/2019 CLINICAL DATA:  Fall EXAM: LEFT WRIST - COMPLETE 3+ VIEW COMPARISON:  None. FINDINGS: There is a complete anterior volar dislocation of the lunate. There is superior migration of the radius with volar angulation. There is comminuted fracture seen of the distal radius and ulnar styloid with small fracture fragments seen along the radial surface.  There is ulnar subluxation of the distal ulna. Significant surrounding soft tissue swelling is seen. There is a mild metallic foreign body seen overlying the fifth metacarpal head. IMPRESSION: Complete anterior volar dislocation of the lunate. Comminuted fracture of the distal radius with superior impaction of the distal radius. Electronically Signed   By: Jonna Clark M.D.   On: 07/26/2019 21:55   Ct  Head Wo Contrast  Result Date: 07/26/2019 CLINICAL DATA:  Fall from tree. EXAM: CT HEAD WITHOUT CONTRAST CT CERVICAL SPINE WITHOUT CONTRAST TECHNIQUE: Multidetector CT imaging of the head and cervical spine was performed following the standard protocol without intravenous contrast. Multiplanar CT image reconstructions of the cervical spine were also generated. COMPARISON:  None. FINDINGS: CT HEAD FINDINGS Brain: No acute intracranial abnormality. Specifically, no hemorrhage, hydrocephalus, mass lesion, acute infarction, or significant intracranial injury. Vascular: No hyperdense vessel or unexpected calcification. Skull: No acute calvarial abnormality. Sinuses/Orbits: Visualized paranasal sinuses and mastoids clear. Orbital soft tissues unremarkable. Other: None CT CERVICAL SPINE FINDINGS Alignment: Normal Skull base and vertebrae: No acute fracture. No primary bone lesion or focal pathologic process. Soft tissues and spinal canal: No prevertebral fluid or swelling. No visible canal hematoma. Disc levels: Degenerative disc disease at C4-5 with disc space narrowing and spurring. Upper chest: Posterior right 1st rib fracture noted. There appears to be a right effusion partially imaged. See chest CT report for full discussion. Other: None IMPRESSION: No acute intracranial abnormality. Fracture through the posterior right first rib. Probable right pleural effusion. No acute bony abnormality in the cervical spine. Electronically Signed   By: Charlett NoseKevin  Dover M.D.   On: 07/26/2019 21:05   Ct Chest W Contrast  Addendum  Date: 07/26/2019   ADDENDUM REPORT: 07/26/2019 21:27 ADDENDUM: A minimally displaced proximal right first rib fracture is present. No additional rib fractures are present. Electronically Signed   By: Marin Robertshristopher  Mattern M.D.   On: 07/26/2019 21:27   Result Date: 07/26/2019 CLINICAL DATA:  Abd trauma, blunt, stable. Hit by tree branch. Fell from tree. EXAM: CT CHEST, ABDOMEN, AND PELVIS WITH CONTRAST TECHNIQUE: Multidetector CT imaging of the chest, abdomen and pelvis was performed following the standard protocol during bolus administration of intravenous contrast. CONTRAST:  100mL OMNIPAQUE IOHEXOL 300 MG/ML  SOLN COMPARISON:  None. FINDINGS: CT CHEST FINDINGS Cardiovascular: Heart size is normal. In aorta and great vessel origins are within normal limits. Pulmonary arteries are normal. There is no significant pericardial effusion or trauma. Mediastinum/Nodes: Significant mediastinal, hilar, or axillary adenopathy is present. There is no significant mediastinal effusion or air. Lungs/Pleura: Minimal dependent atelectasis is present. Lungs are otherwise clear without focal nodule, mass, or airspace disease. Is no pulmonary contusion. No pneumothorax is present. No significant pleural effusions are present. Musculoskeletal: 12 rib-bearing thoracic type vertebral bodies are present. No acute or healing fractures are present. Ribs are unremarkable. CT ABDOMEN PELVIS FINDINGS Hepatobiliary: Hemorrhage is present along the posterior margin of the liver. No focal hepatic contusion is present. The common bile duct and gallbladder normal. Pancreas: Unremarkable. No pancreatic ductal dilatation or surrounding inflammatory changes. Spleen: No splenic injury or perisplenic hematoma. Adrenals/Urinary Tract: There is diffuse hemorrhage about the right adrenal gland. Focal collection is present. Left adrenal gland is normal. Simple cyst in the left kidney measures 5.3 x 4.1 x 4.3 cm. Kidneys and ureters are otherwise within  normal limits bilaterally. The urinary bladder is within normal limits. Stomach/Bowel: Stomach and duodenum are within normal limits. Small bowel is unremarkable. Terminal ileum is normal. The appendix is visualized and normal. The ascending and transverse colon are normal. Descending and sigmoid colon are within normal limits. Vascular/Lymphatic: Scattered atherosclerotic calcifications are present. There is no aneurysm. No significant retroperitoneal adenopathy is present. Reproductive: Prostate is unremarkable. Other: No abdominal wall hernia or abnormality. No abdominopelvic ascites. Musculoskeletal: Left transverse process fractures are present at L1, L2, and L3. Fracture fragments are displaced posteriorly  1 shaft width. Vertebral body heights are maintained. No additional fractures present in the spine. Sacrum is intact. A comminuted left femoral neck fracture is present. Varus angulation is noted. Right hip is located. Left acetabulum is intact. IMPRESSION: 1. Right adrenal hemorrhage. 2. Hemorrhage extending into Morrison's pouch and about the free edge of the liver posteriorly. No definite hemorrhagic contusion is present. 3. Left L1, L2, and L3 transverse process fractures 4. Comminuted left femoral neck fracture. Electronically Signed: By: Marin Roberts M.D. On: 07/26/2019 21:18   Ct Cervical Spine Wo Contrast  Result Date: 07/26/2019 CLINICAL DATA:  Fall from tree. EXAM: CT HEAD WITHOUT CONTRAST CT CERVICAL SPINE WITHOUT CONTRAST TECHNIQUE: Multidetector CT imaging of the head and cervical spine was performed following the standard protocol without intravenous contrast. Multiplanar CT image reconstructions of the cervical spine were also generated. COMPARISON:  None. FINDINGS: CT HEAD FINDINGS Brain: No acute intracranial abnormality. Specifically, no hemorrhage, hydrocephalus, mass lesion, acute infarction, or significant intracranial injury. Vascular: No hyperdense vessel or unexpected  calcification. Skull: No acute calvarial abnormality. Sinuses/Orbits: Visualized paranasal sinuses and mastoids clear. Orbital soft tissues unremarkable. Other: None CT CERVICAL SPINE FINDINGS Alignment: Normal Skull base and vertebrae: No acute fracture. No primary bone lesion or focal pathologic process. Soft tissues and spinal canal: No prevertebral fluid or swelling. No visible canal hematoma. Disc levels: Degenerative disc disease at C4-5 with disc space narrowing and spurring. Upper chest: Posterior right 1st rib fracture noted. There appears to be a right effusion partially imaged. See chest CT report for full discussion. Other: None IMPRESSION: No acute intracranial abnormality. Fracture through the posterior right first rib. Probable right pleural effusion. No acute bony abnormality in the cervical spine. Electronically Signed   By: Charlett Nose M.D.   On: 07/26/2019 21:05   Ct Abdomen Pelvis W Contrast  Addendum Date: 07/26/2019   ADDENDUM REPORT: 07/26/2019 21:27 ADDENDUM: A minimally displaced proximal right first rib fracture is present. No additional rib fractures are present. Electronically Signed   By: Marin Roberts M.D.   On: 07/26/2019 21:27   Result Date: 07/26/2019 CLINICAL DATA:  Abd trauma, blunt, stable. Hit by tree branch. Fell from tree. EXAM: CT CHEST, ABDOMEN, AND PELVIS WITH CONTRAST TECHNIQUE: Multidetector CT imaging of the chest, abdomen and pelvis was performed following the standard protocol during bolus administration of intravenous contrast. CONTRAST:  OMNIPAQUE IOHEXOL 300 MG/ML  SOLN COMPARISON:  None. FINDINGS: CT CHEST FINDINGS Cardiovascular: Heart size is normal. In aorta and great vessel origins are within normal limits. Pulmonary arteries are normal. There is no significant pericardial effusion or trauma. Mediastinum/Nodes: Significant mediastinal, hilar, or axillary adenopathy is present. There is no significant mediastinal effusion or air. Lungs/Pleura:  Minimal dependent atelectasis is present. Lungs are otherwise clear without focal nodule, mass, or airspace disease. Is no pulmonary contusion. No pneumothorax is present. No significant pleural effusions are present. Musculoskeletal: 12 rib-bearing thoracic type vertebral bodies are present. No acute or healing fractures are present. Ribs are unremarkable. CT ABDOMEN PELVIS FINDINGS Hepatobiliary: Hemorrhage is present along the posterior margin of the liver. No focal hepatic contusion is present. The common bile duct and gallbladder normal. Pancreas: Unremarkable. No pancreatic ductal dilatation or surrounding inflammatory changes. Spleen: No splenic injury or perisplenic hematoma. Adrenals/Urinary Tract: There is diffuse hemorrhage about the right adrenal gland. Focal collection is present. Left adrenal gland is normal. Simple cyst in the left kidney measures 5.3 x 4.1 x 4.3 cm. Kidneys and ureters are otherwise within  normal limits bilaterally. The urinary bladder is within normal limits. Stomach/Bowel: Stomach and duodenum are within normal limits. Small bowel is unremarkable. Terminal ileum is normal. The appendix is visualized and normal. The ascending and transverse colon are normal. Descending and sigmoid colon are within normal limits. Vascular/Lymphatic: Scattered atherosclerotic calcifications are present. There is no aneurysm. No significant retroperitoneal adenopathy is present. Reproductive: Prostate is unremarkable. Other: No abdominal wall hernia or abnormality. No abdominopelvic ascites. Musculoskeletal: Left transverse process fractures are present at L1, L2, and L3. Fracture fragments are displaced posteriorly 1 shaft width. Vertebral body heights are maintained. No additional fractures present in the spine. Sacrum is intact. A comminuted left femoral neck fracture is present. Varus angulation is noted. Right hip is located. Left acetabulum is intact. IMPRESSION: 1. Right adrenal hemorrhage. 2.  Hemorrhage extending into Morrison's pouch and about the free edge of the liver posteriorly. No definite hemorrhagic contusion is present. 3. Left L1, L2, and L3 transverse process fractures 4. Comminuted left femoral neck fracture. Electronically Signed: By: Marin Roberts M.D. On: 07/26/2019 21:18   Dg Chest Port 1 View  Result Date: 07/26/2019 CLINICAL DATA:  Fall out of tree. EXAM: PORTABLE CHEST 1 VIEW COMPARISON:  None. FINDINGS: The heart size and mediastinal contours are within normal limits. Both lungs are clear. The visualized skeletal structures are unremarkable. IMPRESSION: No active disease. Electronically Signed   By: Charlett Nose M.D.   On: 07/26/2019 20:42   Dg Knee Complete 4 Views Left  Result Date: 07/26/2019 CLINICAL DATA:  Knee pain after fall EXAM: LEFT KNEE - COMPLETE 4+ VIEW COMPARISON:  None. FINDINGS: No fracture or dislocation. Small knee joint effusion is seen. Soft tissue swelling seen around the medial aspect of the knee. The linear calcifications seen in the medial compartment of the knee. IMPRESSION: No acute osseous abnormality. Soft tissue swelling seen around the medial knee with a small knee joint effusion. 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 Electronically Signed   By: Jonna Clark M.D.   On: 07/26/2019 21:57   Dg Hip Port Unilat With Pelvis 1v Left  Result Date: 07/26/2019 CLINICAL DATA:  Left hip pain following a fall out of a tree. EXAM: DG HIP (WITH OR WITHOUT PELVIS) 1V PORT LEFT COMPARISON:  None. FINDINGS: Left femoral neck fracture with mild proximal displacement of the distal fragment and mild varus angulation. No other fractures seen. IMPRESSION: Left femoral neck fracture, as described above. Electronically Signed   By: Beckie Salts M.D.   On: 07/26/2019 20:41    Procedures .Sedation  Date/Time: 07/26/2019 11:20 PM Performed by: Charlynne Pander, MD Authorized by: Charlynne Pander, MD   Consent:    Consent  obtained:  Verbal   Consent given by:  Patient   Risks discussed:  Allergic reaction, dysrhythmia, inadequate sedation, nausea, prolonged hypoxia resulting in organ damage, prolonged sedation necessitating reversal, respiratory compromise necessitating ventilatory assistance and intubation and vomiting   Alternatives discussed:  Analgesia without sedation, anxiolysis and regional anesthesia Universal protocol:    Procedure explained and questions answered to patient or proxy's satisfaction: yes     Relevant documents present and verified: yes     Test results available and properly labeled: yes     Imaging studies available: yes     Required blood products, implants, devices, and special equipment available: yes     Site/side marked: yes     Immediately prior to procedure a time out was called: yes  Patient identity confirmation method:  Verbally with patient Indications:    Procedure necessitating sedation performed by:  Physician performing sedation Pre-sedation assessment:    Time since last food or drink:  10 hours   ASA classification: class 1 - normal, healthy patient     Neck mobility: normal     Mouth opening:  3 or more finger widths   Thyromental distance:  4 finger widths   Mallampati score:  I - soft palate, uvula, fauces, pillars visible   Pre-sedation assessments completed and reviewed: airway patency, cardiovascular function, hydration status, mental status, nausea/vomiting, pain level, respiratory function and temperature   Immediate pre-procedure details:    Reassessment: Patient reassessed immediately prior to procedure     Reviewed: vital signs, relevant labs/tests and NPO status     Verified: bag valve mask available, emergency equipment available, intubation equipment available, IV patency confirmed, oxygen available and suction available   Procedure details (see MAR for exact dosages):    Preoxygenation:  Nasal cannula   Sedation:  Propofol   Intra-procedure  monitoring:  Blood pressure monitoring, cardiac monitor, continuous pulse oximetry, frequent LOC assessments, frequent vital sign checks and continuous capnometry   Intra-procedure events: none     Total Provider sedation time (minutes):  30 Post-procedure details:    Attendance: Constant attendance by certified staff until patient recovered     Recovery: Patient returned to pre-procedure baseline     Post-sedation assessments completed and reviewed: airway patency, cardiovascular function, hydration status, mental status, nausea/vomiting, pain level, respiratory function and temperature     Patient is stable for discharge or admission: yes     Patient tolerance:  Tolerated well, no immediate complications   (including critical care time)  CRITICAL CARE Performed by: Richardean Canalavid H Jakaria Lavergne   Total critical care time: 30  minutes  Critical care time was exclusive of separately billable procedures and treating other patients.  Critical care was necessary to treat or prevent imminent or life-threatening deterioration.  Critical care was time spent personally by me on the following activities: development of treatment plan with patient and/or surrogate as well as nursing, discussions with consultants, evaluation of patient's response to treatment, examination of patient, obtaining history from patient or surrogate, ordering and performing treatments and interventions, ordering and review of laboratory studies, ordering and review of radiographic studies, pulse oximetry and re-evaluation of patient's condition.   Medications Ordered in ED Medications  lidocaine (XYLOCAINE) 2 % (with pres) injection 200 mg (200 mg Infiltration Not Given 07/26/19 2307)  enoxaparin (LOVENOX) injection 40 mg (has no administration in time range)  HYDROmorphone (DILAUDID) injection 1-2 mg (has no administration in time range)  oxyCODONE (Oxy IR/ROXICODONE) immediate release tablet 10 mg (has no administration in time range)    ondansetron (ZOFRAN-ODT) disintegrating tablet 4 mg (has no administration in time range)    Or  ondansetron (ZOFRAN) injection 4 mg (has no administration in time range)  pantoprazole (PROTONIX) EC tablet 40 mg (has no administration in time range)    Or  pantoprazole (PROTONIX) injection 40 mg (has no administration in time range)  morphine 4 MG/ML injection 4 mg (4 mg Intravenous Given 07/26/19 2023)  Tdap (BOOSTRIX) injection 0.5 mL (0.5 mLs Intramuscular Given 07/26/19 2021)  sodium chloride 0.9 % bolus 1,000 mL (0 mLs Intravenous Stopped 07/26/19 2040)  potassium chloride 10 mEq in 100 mL IVPB (10 mEq Intravenous New Bag/Given 07/26/19 2227)  iohexol (OMNIPAQUE) 300 MG/ML solution 100 mL (100 mLs Intravenous Contrast Given 07/26/19  2040)  sodium chloride 0.9 % bolus 1,000 mL (1,000 mLs Intravenous New Bag/Given 07/26/19 2148)  HYDROmorphone (DILAUDID) injection 1 mg (1 mg Intravenous Given 07/26/19 2147)  ondansetron (ZOFRAN) injection 4 mg (4 mg Intravenous Given 07/26/19 2148)  HYDROmorphone (DILAUDID) injection 1 mg (1 mg Intravenous Given 07/26/19 2244)  propofol (DIPRIVAN) 10 mg/mL bolus/IV push 200 mg (160 mg Intravenous Given by Other 07/26/19 2305)  propofol (DIPRIVAN) 10 mg/mL bolus/IV push (30 mcg Intravenous Given 07/26/19 2252)     Initial Impression / Assessment and Plan / ED Course  I have reviewed the triage vital signs and the nursing notes.  Pertinent labs & imaging results that were available during my care of the patient were reviewed by me and considered in my medical decision making (see chart for details).       Robert Hahn is a 58 y.o. male here with trauma. Level 2 trauma called on arrival.  Patient has obvious right elbow as well as left wrist and left hip deformities. He has multiple abrasions as well .  He also has bruising on the right abdomen and chest area has diminished breath sounds on the right chest. Will do trauma scans, xrays.   9:40 PM CT showed adrenal  hemorrhage with free fluid. Also L1-3 fractures. I talked to Dr. Derrell Lolling from trauma to see patient. I talked to Dr. Lovell Sheehan regarding the lumbar fractures. He states that he doesn't need TLSO fractures and just need pain control.   11:36 PM Xray showed L hip fracture, L wrist lunate dislocation, R posterior elbow dislocation. Dr. Jena Gauss performed reduction and I performed conscious sedation. Trauma at bedside to admit.    Final Clinical Impressions(s) / ED Diagnoses   Final diagnoses:  Fall  Adrenal hemorrhage (HCC)  Lumbar compression fracture, closed, initial encounter [facility])  Closed fracture of left hip, initial encounter [facility])  Elbow dislocation, right, initial encounter    ED Discharge Orders    None       Charlynne Pander, MD 07/26/19 2336

## 2019-07-26 NOTE — ED Notes (Signed)
Dr. Darl Householder and Dr. Doreatha Martin at patients bedside

## 2019-07-26 NOTE — ED Notes (Signed)
ED TO INPATIENT HANDOFF REPORT  ED Nurse Name and Phone #:  [phone]  S Name/Age/Gender Robert Hahn 58 y.o. male Room/Bed: 019C/019C  Code Status   Code Status: Full Code  Home/SNF/Other Home Patient oriented to: self, place, time and situation Is this baseline? Yes   Triage Complete: Triage complete  Chief Complaint Fall for 20 feet Multiple Inj  Triage Note Patient brought in by [facility] due falling out of tree. Patient was in tree cutting a branch when ladder fell from under him and he fell 67ft out of tree. Denies LOC. Wife witnessed fall and call EMS  Obvious deformity to L. Wrist, R. Elbow, shortening and deformity to LLE,   Pain to the upper back and R. Shoulder. Rating pain a 10/10  IV X2 Left AC. 18gauges  vss   Allergies No Known Allergies  Level of Care/Admitting Diagnosis ED Disposition    ED Disposition Condition Comment   Admit  Hospital Area: [facility] [[id number]]  Level of Care: Med-Surg [16]  Covid Evaluation: N/A  Diagnosis: Fall [290176]  Admitting Physician: TRAUMA MD [[id number]]  Attending Physician: TRAUMA MD [[id number]]  Estimated length of stay: 3 - 4 days  Certification:: I certify this patient will need inpatient services for at least 2 midnights  Bed request comments: 5N  PT Class (Do Not Modify): Inpatient [101]  PT Acc Code (Do Not Modify): Private [1]       B Medical/Surgery History No past medical history on file.    A IV Location/Drains/Wounds Patient Lines/Drains/Airways Status   Active Line/Drains/Airways    Name:   Placement date:   Placement time:   Site:   Days:   Peripheral IV 07/26/19 Left Antecubital   07/26/19    2000    Antecubital   less than 1   Peripheral IV 07/26/19 Left Forearm   07/26/19    2000    Forearm   less than 1          Intake/Output Last 24 hours  Intake/Output Summary (Last 24 hours) at 07/26/2019 2318 Last data filed at 07/26/2019 2040 Gross per 24 hour  Intake 1200  ml  Output -  Net 1200 ml    Labs/Imaging Results for orders placed or performed during the hospital encounter of 07/26/19 (from the past 48 hour(s))  CDS serology     Status: None   Collection Time: 07/26/19  8:01 PM  Result Value Ref Range   CDS serology specimen      SPECIMEN WILL BE HELD FOR 14 DAYS IF TESTING IS REQUIRED    Comment: Performed at [facility], [address] [place], [place] [address]  Comprehensive metabolic panel     Status: Abnormal   Collection Time: 07/26/19  8:01 PM  Result Value Ref Range   Sodium 137 135 - 145 mmol/L   Potassium 2.8 (L) 3.5 - 5.1 mmol/L   Chloride 106 98 - 111 mmol/L   CO2 15 (L) 22 - 32 mmol/L   Glucose, Bld 191 (H) 70 - 99 mg/dL   BUN 19 6 - 20 mg/dL   Creatinine, Ser 1.91 (H) 0.61 - 1.24 mg/dL   Calcium 8.9 8.9 - 47.8 mg/dL   Total Protein 6.1 (L) 6.5 - 8.1 g/dL   Albumin 3.9 3.5 - 5.0 g/dL   AST 295 (H) 15 - 41 U/L   ALT 111 (H) 0 - 44 U/L   Alkaline Phosphatase 56 38 - 126 U/L  Total Bilirubin 0.7 0.3 - 1.2 mg/dL   GFR calc non Af Amer >60 >60 mL/min   GFR calc Af Amer >60 >60 mL/min   Anion gap 16 (H) 5 - 15    Comment: Performed at [facility], [address], [place], [place] [address]  CBC     Status: Abnormal   Collection Time: 07/26/19  8:01 PM  Result Value Ref Range   WBC 21.0 (H) 4.0 - 10.5 K/uL   RBC 5.65 4.22 - 5.81 MIL/uL   Hemoglobin 16.4 13.0 - 17.0 g/dL   HCT 60.4 54.0 - 98.1 %   MCV 86.0 80.0 - 100.0 fL   MCH 29.0 26.0 - 34.0 pg   MCHC 33.7 30.0 - 36.0 g/dL   RDW 19.1 47.8 - 29.5 %   Platelets 226 150 - 400 K/uL   nRBC 0.0 0.0 - 0.2 %    Comment: Performed at [facility], [address], [place], [place] [address]  Ethanol     Status: None   Collection Time: 07/26/19  8:01 PM  Result Value Ref Range   Alcohol, Ethyl (B) <10 <10 mg/dL    Comment: (NOTE) Lowest detectable limit for serum alcohol is 10 mg/dL. For medical purposes only. Performed at [facility], [address], [place], [place] [address]   Lactic acid, plasma     Status: Abnormal   Collection Time: 07/26/19  8:01 PM  Result Value Ref Range   Lactic Acid, Venous 5.1 (HH) 0.5 - 1.9 mmol/L    Comment: CRITICAL RESULT CALLED TO, READ BACK BY AND VERIFIED WITH: Marissa Nestle RN @ 2044 ON 07/26/2019 BY TEMOCHE, H Performed at [facility], [address], [place], [place] [address]   Protime-INR     Status: None   Collection Time: 07/26/19  8:01 PM  Result Value Ref Range   Prothrombin Time 13.7 11.4 - 15.2 seconds   INR 1.1 0.8 - 1.2    Comment: (NOTE) INR goal varies based on device and disease states. Performed at [facility], [address], [place], [place] [address]   Sample to Blood Bank     Status: None   Collection Time: 07/26/19  8:01 PM  Result Value Ref Range   Blood Bank Specimen SAMPLE AVAILABLE FOR TESTING    Sample Expiration      07/27/2019,2359 Performed at [facility], [address] [place], [place] [address]   I-stat chem 8, ED     Status: Abnormal   Collection Time: 07/26/19  8:07 PM  Result Value Ref Range   Sodium 141 135 - 145 mmol/L   Potassium 2.9 (L) 3.5 - 5.1 mmol/L   Chloride 108 98 - 111 mmol/L   BUN 19 6 - 20 mg/dL   Creatinine, Ser 0.10 0.61 - 1.24 mg/dL   Glucose, Bld 272 (H) 70 - 99 mg/dL   Calcium, Ion 5.36 6.44 - 1.40 mmol/L   TCO2 17 (L) 22 - 32 mmol/L   Hemoglobin 16.0 13.0 - 17.0 g/dL   HCT 03.4 74.2 - 59.5 %  SARS Coronavirus 2 [facility] order, Performed in [facility] hospital lab) Nasopharyngeal Nasopharyngeal Swab     Status: None   Collection Time: 07/26/19  8:12 PM   Specimen: Nasopharyngeal Swab  Result Value Ref Range   SARS Coronavirus 2 NEGATIVE NEGATIVE    Comment: (NOTE) If result is NEGATIVE SARS-CoV-2 target nucleic acids are NOT DETECTED.  The SARS-CoV-2 RNA is generally detectable in upper and lower  respiratory specimens during the acute phase of infection. The lowest  concentration of SARS-CoV-2  viral copies this assay can detect is 250  copies / mL. A negative result does not preclude SARS-CoV-2 infection  and should not be used as the sole basis for treatment or other  patient management decisions.  A negative result may occur with  improper specimen collection / handling, submission of specimen other  than nasopharyngeal swab, presence of viral mutation(s) within the  areas targeted by this assay, and inadequate number of viral copies  (<250 copies / mL). A negative result must be combined with clinical  observations, patient history, and epidemiological information. If result is POSITIVE SARS-CoV-2 target nucleic acids are DETECTED. The SARS-CoV-2 RNA is generally detectable in upper and lower  respiratory specimens dur ing the acute phase of infection.  Positive  results are indicative of active infection with SARS-CoV-2.  Clinical  correlation with patient history and other diagnostic information is  necessary to determine patient infection status.  Positive results do  not rule out bacterial infection or co-infection with other viruses. If result is PRESUMPTIVE POSTIVE SARS-CoV-2 nucleic acids MAY BE PRESENT.   A presumptive positive result was obtained on the submitted specimen  and confirmed on repeat testing.  While 2019 novel coronavirus  (SARS-CoV-2) nucleic acids may be present in the submitted sample  additional confirmatory testing may be necessary for epidemiological  and / or clinical management purposes  to differentiate between  SARS-CoV-2 and other Sarbecovirus currently known to infect humans.  If clinically indicated additional testing with an alternate test  methodology [id number]) is advised. The SARS-CoV-2 RNA is generally  detectable in upper and lower respiratory sp ecimens during the acute  phase of infection. The expected result is Negative. Fact Sheet for Patients:  StrictlyIdeas.no Fact Sheet for Healthcare  Providers: BankingDealers.co.za This test is not yet approved or cleared by the [place] FDA and has been authorized for detection and/or diagnosis of SARS-CoV-2 by FDA under an Emergency Use Authorization (EUA).  This EUA will remain in effect (meaning this test can be used) for the duration of the COVID-19 declaration under Section 564(b)(1) of the Act, 21 U.S.C. section 360bbb-3(b)(1), unless the authorization is terminated or revoked sooner. Performed at [facility], [place] [address] [place], [place] [address]    Dg Shoulder Right  Result Date: 07/26/2019 CLINICAL DATA:  Right shoulder pain EXAM: RIGHT SHOULDER - 2+ VIEW COMPARISON:  None. FINDINGS: There is no evidence of fracture or dislocation. Visualized portion of the lung is clear. Soft tissues are unremarkable. IMPRESSION: No acute osseous abnormality. Electronically Signed   By: Prudencio Pair M.D.   On: 07/26/2019 21:56   Dg Elbow Complete Right  Result Date: 07/26/2019 CLINICAL DATA:  Pain after fall EXAM: RIGHT ELBOW - COMPLETE 3+ VIEW COMPARISON:  None. FINDINGS: There is a 1.1 cm osseous fragment seen adjacent to the medial epicondyle, likely fracture from the medial epicondyle. There is complete posterior dislocation of the radius and ulna at the elbow. There is also a tiny osseous fragment seen adjacent to the capitellar surface of indeterminate origin. Significant surrounding soft tissue swelling and elbow joint effusion is seen. IMPRESSION: Osseous fragment adjacent to the medial epicondyle, likely medial epicondylar fracture. Complete posterior dislocation at the elbow. The osseous fragment seen adjacent to the capitellum of indeterminate origin. Significant surrounding soft tissue swelling and elbow joint effusion. Electronically Signed  By: Jonna Clark M.D.   On: 07/26/2019 21:59   Dg Wrist Complete Left  Result Date: 07/26/2019 CLINICAL DATA:  Fall EXAM: LEFT WRIST - COMPLETE 3+ VIEW  COMPARISON:  None. FINDINGS: There is a complete anterior volar dislocation of the lunate. There is superior migration of the radius with volar angulation. There is comminuted fracture seen of the distal radius and ulnar styloid with small fracture fragments seen along the radial surface. There is ulnar subluxation of the distal ulna. Significant surrounding soft tissue swelling is seen. There is a mild metallic foreign body seen overlying the fifth metacarpal head. IMPRESSION: Complete anterior volar dislocation of the lunate. Comminuted fracture of the distal radius with superior impaction of the distal radius. Electronically Signed   By: Jonna Clark M.D.   On: 07/26/2019 21:55   Ct Head Wo Contrast  Result Date: 07/26/2019 CLINICAL DATA:  Fall from tree. EXAM: CT HEAD WITHOUT CONTRAST CT CERVICAL SPINE WITHOUT CONTRAST TECHNIQUE: Multidetector CT imaging of the head and cervical spine was performed following the standard protocol without intravenous contrast. Multiplanar CT image reconstructions of the cervical spine were also generated. COMPARISON:  None. FINDINGS: CT HEAD FINDINGS Brain: No acute intracranial abnormality. Specifically, no hemorrhage, hydrocephalus, mass lesion, acute infarction, or significant intracranial injury. Vascular: No hyperdense vessel or unexpected calcification. Skull: No acute calvarial abnormality. Sinuses/Orbits: Visualized paranasal sinuses and mastoids clear. Orbital soft tissues unremarkable. Other: None CT CERVICAL SPINE FINDINGS Alignment: Normal Skull base and vertebrae: No acute fracture. No primary bone lesion or focal pathologic process. Soft tissues and spinal canal: No prevertebral fluid or swelling. No visible canal hematoma. Disc levels: Degenerative disc disease at C4-5 with disc space narrowing and spurring. Upper chest: Posterior right 1st rib fracture noted. There appears to be a right effusion partially imaged. See chest CT report for full discussion. Other:  None IMPRESSION: No acute intracranial abnormality. Fracture through the posterior right first rib. Probable right pleural effusion. No acute bony abnormality in the cervical spine. Electronically Signed   By: Charlett Nose M.D.   On: 07/26/2019 21:05   Ct Chest W Contrast  Addendum Date: 07/26/2019   ADDENDUM REPORT: 07/26/2019 21:27 ADDENDUM: A minimally displaced proximal right first rib fracture is present. No additional rib fractures are present. Electronically Signed   By: Marin Roberts M.D.   On: 07/26/2019 21:27   Result Date: 07/26/2019 CLINICAL DATA:  Abd trauma, blunt, stable. Hit by tree branch. Fell from tree. EXAM: CT CHEST, ABDOMEN, AND PELVIS WITH CONTRAST TECHNIQUE: Multidetector CT imaging of the chest, abdomen and pelvis was performed following the standard protocol during bolus administration of intravenous contrast. CONTRAST:  OMNIPAQUE IOHEXOL 300 MG/ML  SOLN COMPARISON:  None. FINDINGS: CT CHEST FINDINGS Cardiovascular: Heart size is normal. In aorta and great vessel origins are within normal limits. Pulmonary arteries are normal. There is no significant pericardial effusion or trauma. Mediastinum/Nodes: Significant mediastinal, hilar, or axillary adenopathy is present. There is no significant mediastinal effusion or air. Lungs/Pleura: Minimal dependent atelectasis is present. Lungs are otherwise clear without focal nodule, mass, or airspace disease. Is no pulmonary contusion. No pneumothorax is present. No significant pleural effusions are present. Musculoskeletal: 12 rib-bearing thoracic type vertebral bodies are present. No acute or healing fractures are present. Ribs are unremarkable. CT ABDOMEN PELVIS FINDINGS Hepatobiliary: Hemorrhage is present along the posterior margin of the liver. No focal hepatic contusion is present. The common bile duct and gallbladder normal. Pancreas: Unremarkable. No pancreatic ductal dilatation or surrounding  inflammatory changes. Spleen: No  splenic injury or perisplenic hematoma. Adrenals/Urinary Tract: There is diffuse hemorrhage about the right adrenal gland. Focal collection is present. Left adrenal gland is normal. Simple cyst in the left kidney measures 5.3 x 4.1 x 4.3 cm. Kidneys and ureters are otherwise within normal limits bilaterally. The urinary bladder is within normal limits. Stomach/Bowel: Stomach and duodenum are within normal limits. Small bowel is unremarkable. Terminal ileum is normal. The appendix is visualized and normal. The ascending and transverse colon are normal. Descending and sigmoid colon are within normal limits. Vascular/Lymphatic: Scattered atherosclerotic calcifications are present. There is no aneurysm. No significant retroperitoneal adenopathy is present. Reproductive: Prostate is unremarkable. Other: No abdominal wall hernia or abnormality. No abdominopelvic ascites. Musculoskeletal: Left transverse process fractures are present at L1, L2, and L3. Fracture fragments are displaced posteriorly 1 shaft width. Vertebral body heights are maintained. No additional fractures present in the spine. Sacrum is intact. A comminuted left femoral neck fracture is present. Varus angulation is noted. Right hip is located. Left acetabulum is intact. IMPRESSION: 1. Right adrenal hemorrhage. 2. Hemorrhage extending into Morrison's pouch and about the free edge of the liver posteriorly. No definite hemorrhagic contusion is present. 3. Left L1, L2, and L3 transverse process fractures 4. Comminuted left femoral neck fracture. Electronically Signed: By: Marin Robertshristopher  Mattern M.D. On: 07/26/2019 21:18   Ct Cervical Spine Wo Contrast  Result Date: 07/26/2019 CLINICAL DATA:  Fall from tree. EXAM: CT HEAD WITHOUT CONTRAST CT CERVICAL SPINE WITHOUT CONTRAST TECHNIQUE: Multidetector CT imaging of the head and cervical spine was performed following the standard protocol without intravenous contrast. Multiplanar CT image reconstructions of the  cervical spine were also generated. COMPARISON:  None. FINDINGS: CT HEAD FINDINGS Brain: No acute intracranial abnormality. Specifically, no hemorrhage, hydrocephalus, mass lesion, acute infarction, or significant intracranial injury. Vascular: No hyperdense vessel or unexpected calcification. Skull: No acute calvarial abnormality. Sinuses/Orbits: Visualized paranasal sinuses and mastoids clear. Orbital soft tissues unremarkable. Other: None CT CERVICAL SPINE FINDINGS Alignment: Normal Skull base and vertebrae: No acute fracture. No primary bone lesion or focal pathologic process. Soft tissues and spinal canal: No prevertebral fluid or swelling. No visible canal hematoma. Disc levels: Degenerative disc disease at C4-5 with disc space narrowing and spurring. Upper chest: Posterior right 1st rib fracture noted. There appears to be a right effusion partially imaged. See chest CT report for full discussion. Other: None IMPRESSION: No acute intracranial abnormality. Fracture through the posterior right first rib. Probable right pleural effusion. No acute bony abnormality in the cervical spine. Electronically Signed   By: Charlett NoseKevin  Dover M.D.   On: 07/26/2019 21:05   Ct Abdomen Pelvis W Contrast  Addendum Date: 07/26/2019   ADDENDUM REPORT: 07/26/2019 21:27 ADDENDUM: A minimally displaced proximal right first rib fracture is present. No additional rib fractures are present. Electronically Signed   By: Marin Robertshristopher  Mattern M.D.   On: 07/26/2019 21:27   Result Date: 07/26/2019 CLINICAL DATA:  Abd trauma, blunt, stable. Hit by tree branch. Fell from tree. EXAM: CT CHEST, ABDOMEN, AND PELVIS WITH CONTRAST TECHNIQUE: Multidetector CT imaging of the chest, abdomen and pelvis was performed following the standard protocol during bolus administration of intravenous contrast. CONTRAST:  100mL OMNIPAQUE IOHEXOL 300 MG/ML  SOLN COMPARISON:  None. FINDINGS: CT CHEST FINDINGS Cardiovascular: Heart size is normal. In aorta and great  vessel origins are within normal limits. Pulmonary arteries are normal. There is no significant pericardial effusion or trauma. Mediastinum/Nodes: Significant mediastinal, hilar, or axillary adenopathy is  present. There is no significant mediastinal effusion or air. Lungs/Pleura: Minimal dependent atelectasis is present. Lungs are otherwise clear without focal nodule, mass, or airspace disease. Is no pulmonary contusion. No pneumothorax is present. No significant pleural effusions are present. Musculoskeletal: 12 rib-bearing thoracic type vertebral bodies are present. No acute or healing fractures are present. Ribs are unremarkable. CT ABDOMEN PELVIS FINDINGS Hepatobiliary: Hemorrhage is present along the posterior margin of the liver. No focal hepatic contusion is present. The common bile duct and gallbladder normal. Pancreas: Unremarkable. No pancreatic ductal dilatation or surrounding inflammatory changes. Spleen: No splenic injury or perisplenic hematoma. Adrenals/Urinary Tract: There is diffuse hemorrhage about the right adrenal gland. Focal collection is present. Left adrenal gland is normal. Simple cyst in the left kidney measures 5.3 x 4.1 x 4.3 cm. Kidneys and ureters are otherwise within normal limits bilaterally. The urinary bladder is within normal limits. Stomach/Bowel: Stomach and duodenum are within normal limits. Small bowel is unremarkable. Terminal ileum is normal. The appendix is visualized and normal. The ascending and transverse colon are normal. Descending and sigmoid colon are within normal limits. Vascular/Lymphatic: Scattered atherosclerotic calcifications are present. There is no aneurysm. No significant retroperitoneal adenopathy is present. Reproductive: Prostate is unremarkable. Other: No abdominal wall hernia or abnormality. No abdominopelvic ascites. Musculoskeletal: Left transverse process fractures are present at L1, L2, and L3. Fracture fragments are displaced posteriorly 1 shaft  width. Vertebral body heights are maintained. No additional fractures present in the spine. Sacrum is intact. A comminuted left femoral neck fracture is present. Varus angulation is noted. Right hip is located. Left acetabulum is intact. IMPRESSION: 1. Right adrenal hemorrhage. 2. Hemorrhage extending into Morrison's pouch and about the free edge of the liver posteriorly. No definite hemorrhagic contusion is present. 3. Left L1, L2, and L3 transverse process fractures 4. Comminuted left femoral neck fracture. Electronically Signed: By: Marin Roberts M.D. On: 07/26/2019 21:18   Dg Chest Port 1 View  Result Date: 07/26/2019 CLINICAL DATA:  Fall out of tree. EXAM: PORTABLE CHEST 1 VIEW COMPARISON:  None. FINDINGS: The heart size and mediastinal contours are within normal limits. Both lungs are clear. The visualized skeletal structures are unremarkable. IMPRESSION: No active disease. Electronically Signed   By: Charlett Nose M.D.   On: 07/26/2019 20:42   Dg Knee Complete 4 Views Left  Result Date: 07/26/2019 CLINICAL DATA:  Knee pain after fall EXAM: LEFT KNEE - COMPLETE 4+ VIEW COMPARISON:  None. FINDINGS: No fracture or dislocation. Small knee joint effusion is seen. Soft tissue swelling seen around the medial aspect of the knee. The linear calcifications seen in the medial compartment of the knee. IMPRESSION: No acute osseous abnormality. Soft tissue swelling seen around the medial knee with a small knee joint effusion. 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 Electronically Signed   By: Jonna Clark M.D.   On: 07/26/2019 21:57   Dg Hip Port Unilat With Pelvis 1v Left  Result Date: 07/26/2019 CLINICAL DATA:  Left hip pain following a fall out of a tree. EXAM: DG HIP (WITH OR WITHOUT PELVIS) 1V PORT LEFT COMPARISON:  None. FINDINGS: Left femoral neck fracture with mild proximal displacement of the distal fragment and mild varus angulation. No other fractures seen.  IMPRESSION: Left femoral neck fracture, as described above. Electronically Signed   By: Beckie Salts M.D.   On: 07/26/2019 20:41    Pending Labs Unresulted Labs (From admission, onward)    Start  Ordered   07/27/19 0500  CBC  Tomorrow morning,   R     07/26/19 2314   07/27/19 0500  Basic metabolic panel  Tomorrow morning,   R     07/26/19 2314   07/26/19 2313  HIV antibody (Routine Testing)  Once,   STAT     07/26/19 2314   07/26/19 1955  Urinalysis, Routine w reflex microscopic  (Trauma Panel)  ONCE - STAT,   STAT     07/26/19 1956          Vitals/Pain Today's Vitals   07/26/19 2200 07/26/19 2215 07/26/19 2246 07/26/19 2255  BP: (!) 158/138 (!) 156/81 (!) 153/86 (!) 150/86  Pulse: (!) 104 (!) 104 (!) 102   Resp: 14 20 19  (!) 25  Temp:      TempSrc:      SpO2: 98% 100% 99%   Weight:      Height:      PainSc:        Isolation Precautions Airborne and Contact precautions  Medications Medications  potassium chloride 10 mEq in 100 mL IVPB (10 mEq Intravenous New Bag/Given 07/26/19 2227)  lidocaine (XYLOCAINE) 2 % (with pres) injection 200 mg (200 mg Infiltration Not Given 07/26/19 2307)  enoxaparin (LOVENOX) injection 40 mg (has no administration in time range)  HYDROmorphone (DILAUDID) injection 1-2 mg (has no administration in time range)  oxyCODONE (Oxy IR/ROXICODONE) immediate release tablet 10 mg (has no administration in time range)  ondansetron (ZOFRAN-ODT) disintegrating tablet 4 mg (has no administration in time range)    Or  ondansetron (ZOFRAN) injection 4 mg (has no administration in time range)  pantoprazole (PROTONIX) EC tablet 40 mg (has no administration in time range)    Or  pantoprazole (PROTONIX) injection 40 mg (has no administration in time range)  morphine 4 MG/ML injection 4 mg (4 mg Intravenous Given 07/26/19 2023)  Tdap (BOOSTRIX) injection 0.5 mL (0.5 mLs Intramuscular Given 07/26/19 2021)  sodium chloride 0.9 % bolus 1,000 mL (0 mLs Intravenous  Stopped 07/26/19 2040)  iohexol (OMNIPAQUE) 300 MG/ML solution 100 mL (100 mLs Intravenous Contrast Given 07/26/19 2040)  sodium chloride 0.9 % bolus 1,000 mL (1,000 mLs Intravenous New Bag/Given 07/26/19 2148)  HYDROmorphone (DILAUDID) injection 1 mg (1 mg Intravenous Given 07/26/19 2147)  ondansetron (ZOFRAN) injection 4 mg (4 mg Intravenous Given 07/26/19 2148)  HYDROmorphone (DILAUDID) injection 1 mg (1 mg Intravenous Given 07/26/19 2244)  propofol (DIPRIVAN) 10 mg/mL bolus/IV push 200 mg (160 mg Intravenous Given by Other 07/26/19 2305)  propofol (DIPRIVAN) 10 mg/mL bolus/IV push (30 mcg Intravenous Given 07/26/19 2252)    Mobility walks High fall risk   Focused Assessments    R Recommendations: See Admitting Provider Note  Report given to:   Additional Notes:

## 2019-07-26 NOTE — Consult Note (Signed)
Orthopaedic Trauma Service (OTS) Consult   Patient ID: Robert Hahn MRN: [id number] DOB/AGE: 60-23-1962 60 y.o.  Reason for Consult:Polytrauma Referring Physician: Dr. Chaney Malling, MD [facility]  HPI: Robert Hahn is an 58 y.o. male who is being seen in consultation at the request of Dr. Silverio Lay for evaluation of polytrauma.  The patient does not have a significant medical history who fell 20 feet from a ladder.  According to the patient he was cutting a limb when he fell off and hit the ladder and landed on his hands and hip.  Presented as a level 2 trauma.  The patient presented right away.  Currently rating his pain a 10 out of 10.  Orthopedic injuries include a right elbow fracture dislocation, left displaced femoral neck fracture, and a perilunate fracture dislocation.  Other injuries included a adrenal hemorrhage as well as a transverse process fractures.  Upon evaluation patient is currently complaining of left hand numbness.  Right elbow pain.  As well as a left hip pain.  Denies any injuries to his right lower extremity.  States that the pain is worse when he tries to move any of his extremities.  It is improved with pain medication.  The patient is very active.  He denies any tobacco use.  He is gainfully employed.  His daughter is a emergency room nurse here at [facility].   No past medical history on file.  No known surgical history  No family history on file.  Social History:  has no history on file for tobacco, alcohol, and drug.  Allergies: No Known Allergies  Medications:  No current facility-administered medications on file prior to encounter.    Current Outpatient Medications on File Prior to Encounter  Medication Sig Dispense Refill  . loratadine (CLARITIN) 10 MG tablet Take 5 mg by mouth daily.      ROS: Constitutional: No fever or chills Vision: No changes in vision ENT: No difficulty swallowing CV: No chest pain Pulm: No SOB or wheezing GI: No  nausea or vomiting GU: No urgency or inability to hold urine Skin: No poor wound healing Neurologic: No numbness or tingling Psychiatric: No depression or anxiety Heme: No bruising Allergic: No reaction to medications or food   Exam: Blood pressure (!) 150/86, pulse (!) 102, temperature 98.1 F (36.7 C), temperature source Oral, resp. rate (!) 25, height 5\' 10"  (1.778 m), weight 83 kg, SpO2 99 %. General: No significant distress Orientation: Awake alert and oriented x3 Mood and Affect: Cooperative and pleasant Gait: Patient was unable to ambulate secondary to his hip fracture and multiple extremity injuries Coordination and balance: Within normal limits  Right upper extremity: Reveals obvious deformity about the elbow.  Some abrasions to his left shoulder.  No lacerations noted.  No deformity to the wrist or shoulder.  He has active median, radial and ulnar nerve motor function.  He has sensation is intact to light touch in all nerve distributions.  He has no lymphadenopathy.  His reflexes are within normal limits.  He has a 2+ radial pulse.  Left upper extremity: Reveals obvious deformity about the wrist.  There is palpable bone consistent with his lunate at his volar skin.  There is bruising and ecchymosis noted just volar to this lunate.  No skin lesions or lacerations.  He does have active motor function to the AIN, PIN, ulnar nerve.  He does have diminished sensation in his median nerve.  Sensation is intact grossly to his radial and  ulnar nerve.  He has full elbow range of motion and shoulder range of motion.  No instability about the elbow or shoulder.  Obvious tenderness to palpation about the wrist.  Left lower extremity reveals obvious deformity with shortened and externally rotated lower extremity.  No skin lesions and no lacerations.  Some mild abrasions.  No significant tenderness palpation along the distal portion of his leg.  Range of motion was unable to perform secondary to  significant discomfort.  However his ankle is without pain with passive range of motion.  He has full motor and sensory function distally.  No evidence of instability about his knee or ankle.  He has a warm well-perfused foot with 2+ DP and PT pulses.  Right lower extremity: Skin without lesions. No tenderness to palpation. Full painless ROM, full strength in each muscle groups without evidence of instability.   Medical Decision Making: Data: Imaging: X-rays and CT scan of his pelvis and hip show a displaced angulated left femoral neck fracture with notable comminution.  X-rays of the right elbow show a elbow dislocation with associated coronoid fracture versus possible avulsion.  X-rays of the left wrist show a perilunate dislocation with complete disassociation of the lunate and the volar soft tissues.  It appears that is almost tenting the skin.  Labs:  Results for orders placed or performed during the hospital encounter of 07/26/19 (from the past 24 hour(s))  CDS serology     Status: None   Collection Time: 07/26/19  8:01 PM  Result Value Ref Range   CDS serology specimen      SPECIMEN WILL BE HELD FOR 14 DAYS IF TESTING IS REQUIRED  Comprehensive metabolic panel     Status: Abnormal   Collection Time: 07/26/19  8:01 PM  Result Value Ref Range   Sodium 137 135 - 145 mmol/L   Potassium 2.8 (L) 3.5 - 5.1 mmol/L   Chloride 106 98 - 111 mmol/L   CO2 15 (L) 22 - 32 mmol/L   Glucose, Bld 191 (H) 70 - 99 mg/dL   BUN 19 6 - 20 mg/dL   Creatinine, Ser 1.26 (H) 0.61 - 1.24 mg/dL   Calcium 8.9 8.9 - 10.3 mg/dL   Total Protein 6.1 (L) 6.5 - 8.1 g/dL   Albumin 3.9 3.5 - 5.0 g/dL   AST 106 (H) 15 - 41 U/L   ALT 111 (H) 0 - 44 U/L   Alkaline Phosphatase 56 38 - 126 U/L   Total Bilirubin 0.7 0.3 - 1.2 mg/dL   GFR calc non Af Amer >60 >60 mL/min   GFR calc Af Amer >60 >60 mL/min   Anion gap 16 (H) 5 - 15  CBC     Status: Abnormal   Collection Time: 07/26/19  8:01 PM  Result Value Ref  Range   WBC 21.0 (H) 4.0 - 10.5 K/uL   RBC 5.65 4.22 - 5.81 MIL/uL   Hemoglobin 16.4 13.0 - 17.0 g/dL   HCT 48.6 39.0 - 52.0 %   MCV 86.0 80.0 - 100.0 fL   MCH 29.0 26.0 - 34.0 pg   MCHC 33.7 30.0 - 36.0 g/dL   RDW 12.7 11.5 - 15.5 %   Platelets 226 150 - 400 K/uL   nRBC 0.0 0.0 - 0.2 %  Ethanol     Status: None   Collection Time: 07/26/19  8:01 PM  Result Value Ref Range   Alcohol, Ethyl (B) <10 <10 mg/dL  Lactic acid, plasma  Status: Abnormal   Collection Time: 07/26/19  8:01 PM  Result Value Ref Range   Lactic Acid, Venous 5.1 (HH) 0.5 - 1.9 mmol/L  Protime-INR     Status: None   Collection Time: 07/26/19  8:01 PM  Result Value Ref Range   Prothrombin Time 13.7 11.4 - 15.2 seconds   INR 1.1 0.8 - 1.2  Sample to Blood Bank     Status: None   Collection Time: 07/26/19  8:01 PM  Result Value Ref Range   Blood Bank Specimen SAMPLE AVAILABLE FOR TESTING    Sample Expiration      07/27/2019,2359 Performed at [facility], [address] [place], [place] [address]   I-stat chem 8, ED     Status: Abnormal   Collection Time: 07/26/19  8:07 PM  Result Value Ref Range   Sodium 141 135 - 145 mmol/L   Potassium 2.9 (L) 3.5 - 5.1 mmol/L   Chloride 108 98 - 111 mmol/L   BUN 19 6 - 20 mg/dL   Creatinine, Ser 9.561.10 0.61 - 1.24 mg/dL   Glucose, Bld 213186 (H) 70 - 99 mg/dL   Calcium, Ion 0.861.17 5.781.15 - 1.40 mmol/L   TCO2 17 (L) 22 - 32 mmol/L   Hemoglobin 16.0 13.0 - 17.0 g/dL   HCT 46.947.0 62.939.0 - 52.852.0 %  SARS Coronavirus 2 [facility](Hospital order, Performed in [facility] hospital lab) Nasopharyngeal Nasopharyngeal Swab     Status: None   Collection Time: 07/26/19  8:12 PM   Specimen: Nasopharyngeal Swab  Result Value Ref Range   SARS Coronavirus 2 NEGATIVE NEGATIVE    Imaging or Labs ordered: Patient will obtain postreduction x-rays as well as CT scans.  We also ordered Buck's traction to be placed to his left lower extremity.  Medical history and chart was reviewed and case  discussed with medical provider.  Assessment/Plan: 58 year old male status post 20 foot fall from a tree with the following orthopedic injuries  1.  Left displaced femoral neck fracture 2.  Left perilunate fracture dislocation 3.  Right elbow fracture dislocation  Other injuries include a adrenal hemorrhage and transverse process fractures.  Patient be admitted to the trauma surgery service.  Closed reduction of the right elbow and left wrist was performed please see procedure note below.  The patient will need to undergo advanced imaging for preoperative planning.  He will need a dedicated hand surgeon for his left wrist.  He will likely need reconstruction of his left wrist as well as his right elbow.  In regards to his left femoral neck fracture due to his age and displacement with comminution I would recommend a total hip arthroplasty.  I will consult 1 of my colleagues in the morning for surgical planning.  Patient will be placed in Buck's traction overnight.  I do not plan for any definitive fixation tomorrow.  More than likely will occur on Tuesday.  I went over imaging and injuries with the patient's daughter who is an ER nurse here.  She is understanding and agrees with treatment plan.  Procedure: Consent was obtained a timeout was performed.  Dr. Silverio LayYao performed conscious sedation using propofol.  A gentle reduction maneuver was performed to the right elbow and a palpable clunk and a reduction of the elbow was confirmed.  The elbow was taken through gentle range of motion which showed no significant subluxation.  This was placed in a long-arm splint.  We then turned our attention to the left perilunate  dislocation.  Manual pressure was provided to the lunate.  The wrist was hyperextended to allow the capitate to enter the lunate.  Traction was placed and a reduction was able to be obtained.  Lateral fluoroscopic imaging showed reduction of the lunate in the radiocarpal joint as well as the  capitate in relation to the lunate.  He was placed in a sugar tong splint.  The patient tolerated the procedure well without complication.  Roby LoftsKevin P. Chevette Fee, MD [facility] [phone] (phone) [phone]) [phone] (office) [email or web address]

## 2019-07-27 ENCOUNTER — Inpatient Hospital Stay (HOSPITAL_COMMUNITY): Payer: BC Managed Care – PPO

## 2019-07-27 DIAGNOSIS — S32009A Unspecified fracture of unspecified lumbar vertebra, initial encounter for closed fracture: Secondary | ICD-10-CM | POA: Insufficient documentation

## 2019-07-27 DIAGNOSIS — S42401A Unspecified fracture of lower end of right humerus, initial encounter for closed fracture: Secondary | ICD-10-CM

## 2019-07-27 DIAGNOSIS — S72002A Fracture of unspecified part of neck of left femur, initial encounter for closed fracture: Secondary | ICD-10-CM

## 2019-07-27 DIAGNOSIS — E2749 Other adrenocortical insufficiency: Secondary | ICD-10-CM

## 2019-07-27 DIAGNOSIS — S63095A Other dislocation of left wrist and hand, initial encounter: Secondary | ICD-10-CM

## 2019-07-27 LAB — BASIC METABOLIC PANEL
Anion gap: 12 (ref 5–15)
BUN: 15 mg/dL (ref 6–20)
CO2: 20 mmol/L — ABNORMAL LOW (ref 22–32)
Calcium: 8.8 mg/dL — ABNORMAL LOW (ref 8.9–10.3)
Chloride: 109 mmol/L (ref 98–111)
Creatinine, Ser: 1.02 mg/dL (ref 0.61–1.24)
GFR calc Af Amer: >60 mL/min (ref >60)
GFR calc non Af Amer: >60 mL/min (ref >60)
Glucose, Bld: 138 mg/dL — ABNORMAL HIGH (ref 70–99)
Potassium: 4.2 mmol/L (ref 3.5–5.1)
Sodium: 141 mmol/L (ref 135–145)

## 2019-07-27 LAB — URINALYSIS, ROUTINE W REFLEX MICROSCOPIC
Bacteria, UA: NONE SEEN
Bilirubin Urine: NEGATIVE
Glucose, UA: 50 mg/dL — AB
Ketones, ur: 5 mg/dL — AB
Leukocytes,Ua: NEGATIVE
Nitrite: NEGATIVE
Protein, ur: NEGATIVE mg/dL
Specific Gravity, Urine: 1.027 (ref 1.005–1.030)
pH: 5 (ref 5.0–8.0)

## 2019-07-27 LAB — CBC
HCT: 47.3 % (ref 39.0–52.0)
Hemoglobin: 16.1 g/dL (ref 13.0–17.0)
MCH: 29.2 pg (ref 26.0–34.0)
MCHC: 34 g/dL (ref 30.0–36.0)
MCV: 85.7 fL (ref 80.0–100.0)
Platelets: 167 10*3/uL (ref 150–400)
RBC: 5.52 MIL/uL (ref 4.22–5.81)
RDW: 13 % (ref 11.5–15.5)
WBC: 13.9 10*3/uL — ABNORMAL HIGH (ref 4.0–10.5)
nRBC: 0 % (ref 0.0–0.2)

## 2019-07-27 MED ORDER — TRANEXAMIC ACID-NACL 1000-0.7 MG/100ML-% IV SOLN
1000.0000 mg | INTRAVENOUS | Status: AC
  Administered 2019-07-28: 1000 mg via INTRAVENOUS
  Filled 2019-07-27: qty 100

## 2019-07-27 MED ORDER — ENSURE PRE-SURGERY PO LIQD
296.0000 mL | Freq: Once | ORAL | Status: AC
  Administered 2019-07-27: 296 mL via ORAL
  Filled 2019-07-27: qty 296

## 2019-07-27 MED ORDER — POVIDONE-IODINE 10 % EX SWAB
2.0000 "application " | Freq: Once | CUTANEOUS | Status: AC
  Administered 2019-07-27: 2 via TOPICAL

## 2019-07-27 MED ORDER — CEFAZOLIN SODIUM-DEXTROSE 2-4 GM/100ML-% IV SOLN
2.0000 g | INTRAVENOUS | Status: AC
  Administered 2019-07-28 (×2): 2 g via INTRAVENOUS
  Filled 2019-07-27: qty 100

## 2019-07-27 MED ORDER — CHLORHEXIDINE GLUCONATE 4 % EX LIQD
60.0000 mL | Freq: Once | CUTANEOUS | Status: AC
  Administered 2019-07-27: 4 via TOPICAL
  Filled 2019-07-27: qty 60

## 2019-07-27 MED ORDER — ACETAMINOPHEN 325 MG PO TABS
650.0000 mg | ORAL_TABLET | Freq: Four times a day (QID) | ORAL | Status: DC
  Administered 2019-07-27 – 2019-07-28 (×4): 650 mg via ORAL
  Filled 2019-07-27 (×4): qty 2

## 2019-07-27 NOTE — Consult Note (Signed)
Reason for Consult: Lumbar transverse process fractures Referring Physician: Trauma  Robert Hahn is an 58 y.o. male.  HPI: The patient is a 58 year old white male who was up on a ladder cutting a limb off a tree.  According to the patient and the limb it knocked the ladder out from under him and he fell to the ground suffering multiple fractures.  He was worked up in the ER to include a CT of the chest abdomen pelvis which demonstrated left L1, L2 and L3 transverse process fractures.  The patient was admitted.  A neurosurgical consultation was requested.  He is also fractured his bilateral arms and left hip.  Hip surgery is tentatively planned for tomorrow.  Presently the patient is alert and pleasant.  He denies headaches, neck pain, numbness, tingling, weakness (other than the limitations from his fractures and orthosis), etc.  He complains of bilateral arm pain and left hip pain.  No past medical history on file.    No family history on file.  Social History:  has no history on file for tobacco, alcohol, and drug.  Allergies: No Known Allergies  Medications:  I have reviewed the patient's current medications. Prior to Admission:  Medications Prior to Admission  Medication Sig Dispense Refill Last Dose  . loratadine (CLARITIN) 10 MG tablet Take 5 mg by mouth daily.   07/26/2019 at Unknown time   Scheduled: . enoxaparin (LOVENOX) injection  40 mg Subcutaneous Q24H  . lidocaine  10 mL Infiltration Once  . pantoprazole  40 mg Oral Daily   Or  . pantoprazole (PROTONIX) IV  40 mg Intravenous Daily   Continuous:  ZOX:[id number] (DILAUDID) injection, ondansetron **OR** ondansetron (ZOFRAN) IV, oxyCODONE Anti-infectives (From admission, onward)   None       Results for orders placed or performed during the hospital encounter of 07/26/19 (from the past 48 hour(s))  CDS serology     Status: None   Collection Time: 07/26/19  8:01 PM  Result Value Ref Range   CDS serology  specimen      SPECIMEN WILL BE HELD FOR 14 DAYS IF TESTING IS REQUIRED    Comment: Performed at [facility], [address], [place], [place] [address]  Comprehensive metabolic panel     Status: Abnormal   Collection Time: 07/26/19  8:01 PM  Result Value Ref Range   Sodium 137 135 - 145 mmol/L   Potassium 2.8 (L) 3.5 - 5.1 mmol/L   Chloride 106 98 - 111 mmol/L   CO2 15 (L) 22 - 32 mmol/L   Glucose, Bld 191 (H) 70 - 99 mg/dL   BUN 19 6 - 20 mg/dL   Creatinine, Ser 9.56 (H) 0.61 - 1.24 mg/dL   Calcium 8.9 8.9 - 21.3 mg/dL   Total Protein 6.1 (L) 6.5 - 8.1 g/dL   Albumin 3.9 3.5 - 5.0 g/dL   AST 086 (H) 15 - 41 U/L   ALT 111 (H) 0 - 44 U/L   Alkaline Phosphatase 56 38 - 126 U/L   Total Bilirubin 0.7 0.3 - 1.2 mg/dL   GFR calc non Af Amer >60 >60 mL/min   GFR calc Af Amer >60 >60 mL/min   Anion gap 16 (H) 5 - 15    Comment: Performed at [facility], [address] [place], [place] [address]  CBC     Status: Abnormal   Collection Time: 07/26/19  8:01 PM  Result Value Ref Range   WBC 21.0 (H)  4.0 - 10.5 K/uL   RBC 5.65 4.22 - 5.81 MIL/uL   Hemoglobin 16.4 13.0 - 17.0 g/dL   HCT 91.4 78.2 - 95.6 %   MCV 86.0 80.0 - 100.0 fL   MCH 29.0 26.0 - 34.0 pg   MCHC 33.7 30.0 - 36.0 g/dL   RDW 21.3 08.6 - 57.8 %   Platelets 226 150 - 400 K/uL   nRBC 0.0 0.0 - 0.2 %    Comment: Performed at [facility], [address] [place], [place] [address]  Ethanol     Status: None   Collection Time: 07/26/19  8:01 PM  Result Value Ref Range   Alcohol, Ethyl (B) <10 <10 mg/dL    Comment: (NOTE) Lowest detectable limit for serum alcohol is 10 mg/dL. For medical purposes only. Performed at [facility], [address], [place], [place] [address]   Lactic acid, plasma     Status: Abnormal   Collection Time: 07/26/19  8:01 PM  Result Value Ref Range   Lactic Acid, Venous 5.1 (HH) 0.5 - 1.9 mmol/L    Comment: CRITICAL RESULT CALLED TO, READ BACK BY AND VERIFIED  WITH: Marissa Nestle RN @ 2044 ON 07/26/2019 BY TEMOCHE, H Performed at [facility], [address], [place], [place] [address]   Protime-INR     Status: None   Collection Time: 07/26/19  8:01 PM  Result Value Ref Range   Prothrombin Time 13.7 11.4 - 15.2 seconds   INR 1.1 0.8 - 1.2    Comment: (NOTE) INR goal varies based on device and disease states. Performed at [facility], [address], [place], [place] [address]   Sample to Blood Bank     Status: None   Collection Time: 07/26/19  8:01 PM  Result Value Ref Range   Blood Bank Specimen SAMPLE AVAILABLE FOR TESTING    Sample Expiration      07/27/2019,2359 Performed at [facility], [address], [place], [place] [address]   I-stat chem 8, ED     Status: Abnormal   Collection Time: 07/26/19  8:07 PM  Result Value Ref Range   Sodium 141 135 - 145 mmol/L   Potassium 2.9 (L) 3.5 - 5.1 mmol/L   Chloride 108 98 - 111 mmol/L   BUN 19 6 - 20 mg/dL   Creatinine, Ser 4.40 0.61 - 1.24 mg/dL   Glucose, Bld 347 (H) 70 - 99 mg/dL   Calcium, Ion 4.25 9.56 - 1.40 mmol/L   TCO2 17 (L) 22 - 32 mmol/L   Hemoglobin 16.0 13.0 - 17.0 g/dL   HCT 38.7 56.4 - 33.2 %  SARS Coronavirus 2 [facility] order, Performed in [facility] hospital lab) Nasopharyngeal Nasopharyngeal Swab     Status: None   Collection Time: 07/26/19  8:12 PM   Specimen: Nasopharyngeal Swab  Result Value Ref Range   SARS Coronavirus 2 NEGATIVE NEGATIVE    Comment: (NOTE) If result is NEGATIVE SARS-CoV-2 target nucleic acids are NOT DETECTED. The SARS-CoV-2 RNA is generally detectable in upper and lower  respiratory specimens during the acute phase of infection. The lowest  concentration of SARS-CoV-2 viral copies this assay can detect is 250  copies / mL. A negative result does not preclude SARS-CoV-2 infection  and should not be used as the sole basis for treatment or other  patient management decisions.  A negative result may occur with  improper  specimen collection / handling, submission  of specimen other  than nasopharyngeal swab, presence of viral mutation(s) within the  areas targeted by this assay, and inadequate number of viral copies  (<250 copies / mL). A negative result must be combined with clinical  observations, patient history, and epidemiological information. If result is POSITIVE SARS-CoV-2 target nucleic acids are DETECTED. The SARS-CoV-2 RNA is generally detectable in upper and lower  respiratory specimens dur ing the acute phase of infection.  Positive  results are indicative of active infection with SARS-CoV-2.  Clinical  correlation with patient history and other diagnostic information is  necessary to determine patient infection status.  Positive results do  not rule out bacterial infection or co-infection with other viruses. If result is PRESUMPTIVE POSTIVE SARS-CoV-2 nucleic acids MAY BE PRESENT.   A presumptive positive result was obtained on the submitted specimen  and confirmed on repeat testing.  While 2019 novel coronavirus  (SARS-CoV-2) nucleic acids may be present in the submitted sample  additional confirmatory testing may be necessary for epidemiological  and / or clinical management purposes  to differentiate between  SARS-CoV-2 and other Sarbecovirus currently known to infect humans.  If clinically indicated additional testing with an alternate test  methodology [phone]) is advised. The SARS-CoV-2 RNA is generally  detectable in upper and lower respiratory sp ecimens during the acute  phase of infection. The expected result is Negative. Fact Sheet for Patients:  BoilerBrush.com.cy Fact Sheet for Healthcare Providers: https://pope.com/ This test is not yet approved or cleared by the [place] FDA and has been authorized for detection and/or diagnosis of SARS-CoV-2 by FDA under an Emergency Use Authorization (EUA).  This EUA will remain in  effect (meaning this test can be used) for the duration of the COVID-19 declaration under Section 564(b)(1) of the Act, 21 U.S.C. section 360bbb-3(b)(1), unless the authorization is terminated or revoked sooner. Performed at [facility], [address], [place], [place] [address]   Urinalysis, Routine w reflex microscopic     Status: Abnormal   Collection Time: 07/27/19 12:16 AM  Result Value Ref Range   Color, Urine STRAW (A) YELLOW   APPearance CLEAR CLEAR   Specific Gravity, Urine 1.027 1.005 - 1.030   pH 5.0 5.0 - 8.0   Glucose, UA 50 (A) NEGATIVE mg/dL   Hgb urine dipstick MODERATE (A) NEGATIVE   Bilirubin Urine NEGATIVE NEGATIVE   Ketones, ur 5 (A) NEGATIVE mg/dL   Protein, ur NEGATIVE NEGATIVE mg/dL   Nitrite NEGATIVE NEGATIVE   Leukocytes,Ua NEGATIVE NEGATIVE   RBC / HPF 6-10 0 - 5 RBC/hpf   WBC, UA 0-5 0 - 5 WBC/hpf   Bacteria, UA NONE SEEN NONE SEEN   Squamous Epithelial / LPF 0-5 0 - 5   Mucus PRESENT     Comment: Performed at [facility], [address], [place], [place] [address]  CBC     Status: Abnormal   Collection Time: 07/27/19  5:21 AM  Result Value Ref Range   WBC 13.9 (H) 4.0 - 10.5 K/uL   RBC 5.52 4.22 - 5.81 MIL/uL   Hemoglobin 16.1 13.0 - 17.0 g/dL   HCT 47.8 29.5 - 62.1 %   MCV 85.7 80.0 - 100.0 fL   MCH 29.2 26.0 - 34.0 pg   MCHC 34.0 30.0 - 36.0 g/dL   RDW 30.8 65.7 - 84.6 %   Platelets 167 150 - 400 K/uL   nRBC 0.0 0.0 - 0.2 %    Comment: Performed at [facility], [place] [address] [place], [place] [address]  Basic metabolic panel     Status: Abnormal   Collection Time: 07/27/19  5:21 AM  Result Value Ref Range   Sodium 141 135 - 145 mmol/L   Potassium 4.2 3.5 - 5.1 mmol/L   Chloride 109 98 - 111 mmol/L   CO2 20 (L) 22 - 32 mmol/L   Glucose, Bld 138 (H) 70 - 99 mg/dL   BUN 15 6 - 20 mg/dL   Creatinine, Ser 1.02 0.61 - 1.24 mg/dL   Calcium 8.8 (L) 8.9 - 10.3 mg/dL   GFR calc non Af Amer >60 >60 mL/min   GFR calc Af  Amer >60 >60 mL/min   Anion gap 12 5 - 15    Comment: Performed at [place] [address], [place], [place] [address]    Dg Shoulder Right  Result Date: 07/26/2019 CLINICAL DATA:  Right shoulder pain EXAM: RIGHT SHOULDER - 2+ VIEW COMPARISON:  None. FINDINGS: There is no evidence of fracture or dislocation. Visualized portion of the lung is clear. Soft tissues are unremarkable. IMPRESSION: No acute osseous abnormality. Electronically Signed   By: Prudencio Pair M.D.   On: 07/26/2019 21:56   Dg Elbow 2 Views Right  Result Date: 07/27/2019 CLINICAL DATA:  Postreduction EXAM: RIGHT ELBOW - 2 VIEW COMPARISON:  None. FINDINGS: There is been interval reduction posterior dislocation the in near anatomic. Ossific fragments are seen adjacent to the medial. Surrounding soft tissue swelling. IMPRESSION: Interval reduction the elbow dislocation in near anatomic alignment. Medial epicondyle fracture. Electronically Signed   By: Prudencio Pair M.D.   On: 07/27/2019 01:51   Dg Elbow Complete Right  Result Date: 07/26/2019 CLINICAL DATA:  Pain after fall EXAM: RIGHT ELBOW - COMPLETE 3+ VIEW COMPARISON:  None. FINDINGS: There is a 1.1 cm osseous fragment seen adjacent to the medial epicondyle, likely fracture from the medial epicondyle. There is complete posterior dislocation of the radius and ulna at the elbow. There is also a tiny osseous fragment seen adjacent to the capitellar surface of indeterminate origin. Significant surrounding soft tissue swelling and elbow joint effusion is seen. IMPRESSION: Osseous fragment adjacent to the medial epicondyle, likely medial epicondylar fracture. Complete posterior dislocation at the elbow. The osseous fragment seen adjacent to the capitellum of indeterminate origin. Significant surrounding soft tissue swelling and elbow joint effusion. Electronically Signed   By: Prudencio Pair M.D.   On: 07/26/2019 21:59   Dg Wrist 2 Views Left  Result Date: 07/27/2019 CLINICAL DATA:   Postreduction EXAM: LEFT WRIST - 2 VIEW COMPARISON:  None. FINDINGS: There is been interval reduction of the lunate dislocation and near anatomic. There is comminuted radial styloid fracture. Soft tissue swelling. IMPRESSION: Interval reduction lunate dislocation in near anatomic alignment. Radial styloid fracture. Electronically Signed   By: Prudencio Pair M.D.   On: 07/27/2019 01:52   Dg Wrist 2 Views Right  Result Date: 07/27/2019 CLINICAL DATA:  Pain after fall EXAM: RIGHT WRIST - 2 VIEW COMPARISON:  None. FINDINGS: Overlying material obscures fine bony detail. Definite acute fracture seen. Radioulnar joint appears to be intact. Overlying soft tissue. IMPRESSION: No acute osseous abnormality. Electronically Signed   By: Prudencio Pair M.D.   On: 07/27/2019 01:54   Dg Wrist Complete Left  Result Date: 07/26/2019 CLINICAL DATA:  Fall EXAM: LEFT WRIST - COMPLETE 3+ VIEW COMPARISON:  None. FINDINGS: There is a complete anterior volar dislocation of the lunate. There is superior migration  of the radius with volar angulation. There is comminuted fracture seen of the distal radius and ulnar styloid with small fracture fragments seen along the radial surface. There is ulnar subluxation of the distal ulna. Significant surrounding soft tissue swelling is seen. There is a mild metallic foreign body seen overlying the fifth metacarpal head. IMPRESSION: Complete anterior volar dislocation of the lunate. Comminuted fracture of the distal radius with superior impaction of the distal radius. Electronically Signed   By: Jonna ClarkBindu  Avutu M.D.   On: 07/26/2019 21:55   Ct Head Wo Contrast  Result Date: 07/26/2019 CLINICAL DATA:  Fall from tree. EXAM: CT HEAD WITHOUT CONTRAST CT CERVICAL SPINE WITHOUT CONTRAST TECHNIQUE: Multidetector CT imaging of the head and cervical spine was performed following the standard protocol without intravenous contrast. Multiplanar CT image reconstructions of the cervical spine were also generated.  COMPARISON:  None. FINDINGS: CT HEAD FINDINGS Brain: No acute intracranial abnormality. Specifically, no hemorrhage, hydrocephalus, mass lesion, acute infarction, or significant intracranial injury. Vascular: No hyperdense vessel or unexpected calcification. Skull: No acute calvarial abnormality. Sinuses/Orbits: Visualized paranasal sinuses and mastoids clear. Orbital soft tissues unremarkable. Other: None CT CERVICAL SPINE FINDINGS Alignment: Normal Skull base and vertebrae: No acute fracture. No primary bone lesion or focal pathologic process. Soft tissues and spinal canal: No prevertebral fluid or swelling. No visible canal hematoma. Disc levels: Degenerative disc disease at C4-5 with disc space narrowing and spurring. Upper chest: Posterior right 1st rib fracture noted. There appears to be a right effusion partially imaged. See chest CT report for full discussion. Other: None IMPRESSION: No acute intracranial abnormality. Fracture through the posterior right first rib. Probable right pleural effusion. No acute bony abnormality in the cervical spine. Electronically Signed   By: Charlett NoseKevin  Dover M.D.   On: 07/26/2019 21:05   Ct Chest W Contrast  Addendum Date: 07/26/2019   ADDENDUM REPORT: 07/26/2019 21:27 ADDENDUM: A minimally displaced proximal right first rib fracture is present. No additional rib fractures are present. Electronically Signed   By: Marin Robertshristopher  Mattern M.D.   On: 07/26/2019 21:27   Result Date: 07/26/2019 CLINICAL DATA:  Abd trauma, blunt, stable. Hit by tree branch. Fell from tree. EXAM: CT CHEST, ABDOMEN, AND PELVIS WITH CONTRAST TECHNIQUE: Multidetector CT imaging of the chest, abdomen and pelvis was performed following the standard protocol during bolus administration of intravenous contrast. CONTRAST:  100mL OMNIPAQUE IOHEXOL 300 MG/ML  SOLN COMPARISON:  None. FINDINGS: CT CHEST FINDINGS Cardiovascular: Heart size is normal. In aorta and great vessel origins are within normal limits.  Pulmonary arteries are normal. There is no significant pericardial effusion or trauma. Mediastinum/Nodes: Significant mediastinal, hilar, or axillary adenopathy is present. There is no significant mediastinal effusion or air. Lungs/Pleura: Minimal dependent atelectasis is present. Lungs are otherwise clear without focal nodule, mass, or airspace disease. Is no pulmonary contusion. No pneumothorax is present. No significant pleural effusions are present. Musculoskeletal: 12 rib-bearing thoracic type vertebral bodies are present. No acute or healing fractures are present. Ribs are unremarkable. CT ABDOMEN PELVIS FINDINGS Hepatobiliary: Hemorrhage is present along the posterior margin of the liver. No focal hepatic contusion is present. The common bile duct and gallbladder normal. Pancreas: Unremarkable. No pancreatic ductal dilatation or surrounding inflammatory changes. Spleen: No splenic injury or perisplenic hematoma. Adrenals/Urinary Tract: There is diffuse hemorrhage about the right adrenal gland. Focal collection is present. Left adrenal gland is normal. Simple cyst in the left kidney measures 5.3 x 4.1 x 4.3 cm. Kidneys and ureters are otherwise within normal  limits bilaterally. The urinary bladder is within normal limits. Stomach/Bowel: Stomach and duodenum are within normal limits. Small bowel is unremarkable. Terminal ileum is normal. The appendix is visualized and normal. The ascending and transverse colon are normal. Descending and sigmoid colon are within normal limits. Vascular/Lymphatic: Scattered atherosclerotic calcifications are present. There is no aneurysm. No significant retroperitoneal adenopathy is present. Reproductive: Prostate is unremarkable. Other: No abdominal wall hernia or abnormality. No abdominopelvic ascites. Musculoskeletal: Left transverse process fractures are present at L1, L2, and L3. Fracture fragments are displaced posteriorly 1 shaft width. Vertebral body heights are  maintained. No additional fractures present in the spine. Sacrum is intact. A comminuted left femoral neck fracture is present. Varus angulation is noted. Right hip is located. Left acetabulum is intact. IMPRESSION: 1. Right adrenal hemorrhage. 2. Hemorrhage extending into Morrison's pouch and about the free edge of the liver posteriorly. No definite hemorrhagic contusion is present. 3. Left L1, L2, and L3 transverse process fractures 4. Comminuted left femoral neck fracture. Electronically Signed: By: Marin Roberts M.D. On: 07/26/2019 21:18   Ct Cervical Spine Wo Contrast  Result Date: 07/26/2019 CLINICAL DATA:  Fall from tree. EXAM: CT HEAD WITHOUT CONTRAST CT CERVICAL SPINE WITHOUT CONTRAST TECHNIQUE: Multidetector CT imaging of the head and cervical spine was performed following the standard protocol without intravenous contrast. Multiplanar CT image reconstructions of the cervical spine were also generated. COMPARISON:  None. FINDINGS: CT HEAD FINDINGS Brain: No acute intracranial abnormality. Specifically, no hemorrhage, hydrocephalus, mass lesion, acute infarction, or significant intracranial injury. Vascular: No hyperdense vessel or unexpected calcification. Skull: No acute calvarial abnormality. Sinuses/Orbits: Visualized paranasal sinuses and mastoids clear. Orbital soft tissues unremarkable. Other: None CT CERVICAL SPINE FINDINGS Alignment: Normal Skull base and vertebrae: No acute fracture. No primary bone lesion or focal pathologic process. Soft tissues and spinal canal: No prevertebral fluid or swelling. No visible canal hematoma. Disc levels: Degenerative disc disease at C4-5 with disc space narrowing and spurring. Upper chest: Posterior right 1st rib fracture noted. There appears to be a right effusion partially imaged. See chest CT report for full discussion. Other: None IMPRESSION: No acute intracranial abnormality. Fracture through the posterior right first rib. Probable right pleural  effusion. No acute bony abnormality in the cervical spine. Electronically Signed   By: Charlett Nose M.D.   On: 07/26/2019 21:05   Ct Abdomen Pelvis W Contrast  Addendum Date: 07/26/2019   ADDENDUM REPORT: 07/26/2019 21:27 ADDENDUM: A minimally displaced proximal right first rib fracture is present. No additional rib fractures are present. Electronically Signed   By: Marin Roberts M.D.   On: 07/26/2019 21:27   Result Date: 07/26/2019 CLINICAL DATA:  Abd trauma, blunt, stable. Hit by tree branch. Fell from tree. EXAM: CT CHEST, ABDOMEN, AND PELVIS WITH CONTRAST TECHNIQUE: Multidetector CT imaging of the chest, abdomen and pelvis was performed following the standard protocol during bolus administration of intravenous contrast. CONTRAST:  OMNIPAQUE IOHEXOL 300 MG/ML  SOLN COMPARISON:  None. FINDINGS: CT CHEST FINDINGS Cardiovascular: Heart size is normal. In aorta and great vessel origins are within normal limits. Pulmonary arteries are normal. There is no significant pericardial effusion or trauma. Mediastinum/Nodes: Significant mediastinal, hilar, or axillary adenopathy is present. There is no significant mediastinal effusion or air. Lungs/Pleura: Minimal dependent atelectasis is present. Lungs are otherwise clear without focal nodule, mass, or airspace disease. Is no pulmonary contusion. No pneumothorax is present. No significant pleural effusions are present. Musculoskeletal: 12 rib-bearing thoracic type vertebral bodies are present. No  acute or healing fractures are present. Ribs are unremarkable. CT ABDOMEN PELVIS FINDINGS Hepatobiliary: Hemorrhage is present along the posterior margin of the liver. No focal hepatic contusion is present. The common bile duct and gallbladder normal. Pancreas: Unremarkable. No pancreatic ductal dilatation or surrounding inflammatory changes. Spleen: No splenic injury or perisplenic hematoma. Adrenals/Urinary Tract: There is diffuse hemorrhage about the right adrenal  gland. Focal collection is present. Left adrenal gland is normal. Simple cyst in the left kidney measures 5.3 x 4.1 x 4.3 cm. Kidneys and ureters are otherwise within normal limits bilaterally. The urinary bladder is within normal limits. Stomach/Bowel: Stomach and duodenum are within normal limits. Small bowel is unremarkable. Terminal ileum is normal. The appendix is visualized and normal. The ascending and transverse colon are normal. Descending and sigmoid colon are within normal limits. Vascular/Lymphatic: Scattered atherosclerotic calcifications are present. There is no aneurysm. No significant retroperitoneal adenopathy is present. Reproductive: Prostate is unremarkable. Other: No abdominal wall hernia or abnormality. No abdominopelvic ascites. Musculoskeletal: Left transverse process fractures are present at L1, L2, and L3. Fracture fragments are displaced posteriorly 1 shaft width. Vertebral body heights are maintained. No additional fractures present in the spine. Sacrum is intact. A comminuted left femoral neck fracture is present. Varus angulation is noted. Right hip is located. Left acetabulum is intact. IMPRESSION: 1. Right adrenal hemorrhage. 2. Hemorrhage extending into Morrison's pouch and about the free edge of the liver posteriorly. No definite hemorrhagic contusion is present. 3. Left L1, L2, and L3 transverse process fractures 4. Comminuted left femoral neck fracture. Electronically Signed: By: Marin Roberts M.D. On: 07/26/2019 21:18   Ct Wrist Left Wo Contrast  Result Date: 07/27/2019 CLINICAL DATA:  Evaluate left wrist EXAM: CT OF THE LEFT WRIST WITHOUT CONTRAST TECHNIQUE: Multidetector CT imaging was performed according to the standard protocol. Multiplanar CT image reconstructions were also generated. COMPARISON:  Radiograph same day FINDINGS: Bones/Joint/Cartilage There is been interval reduction the lunate dislocation. There is a tiny osseous fragments seen surrounding volar hand  dorsal surface the lunate. There is comminuted intra-articular fracture seen radial styloid with small ossific fragments at the radius scaphoid joint space. The radioulnar joint appears fractures. There is diffuse soft tissue Ligaments Suboptimally assessed by CT. Muscles and Tendons The muscles and tendons to be grossly. Soft tissues Diffuse soft tissue swelling seen surrounding the wrist. IMPRESSION: 1. Comminuted intra-articular radial styloid fracture. 2. Interval reduction the tiny ossific fragments. Electronically Signed   By: Jonna Clark M.D.   On: 07/27/2019 01:50   Ct Elbow Right Wo Contrast  Result Date: 07/27/2019 CLINICAL DATA:  Postreduction EXAM: CT OF THE LOWER RIGHT EXTREMITY WITHOUT CONTRAST TECHNIQUE: Multidetector CT imaging of the right lower extremity was performed according to the standard protocol. COMPARISON:  None. FINDINGS: Bones/Joint/Cartilage There is been interval reduction of the posterior elbow dislocation. Slight posterior tilt of the ulna at the trochlear surface. Intra-articular comminuted fracture medial epicondyle. Tiny nondisplaced fracture of the anterior coronoid. There is a moderate elbow effusion seen the radial does articulate the capitellum Ligaments Suboptimally assessed by CT. Muscles and Tendons The muscles appear to intact.  Visualization tendons Soft tissues Significant surrounding soft tissue posteriorly. IMPRESSION: Interval reduction of posterior elbow dislocation. There remains slight posterior tilt of the ulna at the trochlear surface. Comminuted fracture of the medial epicondyle and mildly tiny coronoid process fracture. Elbow joint effusion Electronically Signed   By: Jonna Clark M.D.   On: 07/27/2019 02:02   Dg Chest [facility] [address]  Result Date: 07/26/2019 CLINICAL DATA:  Fall out of tree. EXAM: PORTABLE CHEST 1 VIEW COMPARISON:  None. FINDINGS: The heart size and mediastinal contours are within normal limits. Both lungs are clear. The visualized skeletal  structures are unremarkable. IMPRESSION: No active disease. Electronically Signed   By: Charlett NoseKevin  Dover M.D.   On: 07/26/2019 20:42   Dg Knee Complete 4 Views Left  Result Date: 07/26/2019 CLINICAL DATA:  Knee pain after fall EXAM: LEFT KNEE - COMPLETE 4+ VIEW COMPARISON:  None. FINDINGS: No fracture or dislocation. Small knee joint effusion is seen. Soft tissue swelling seen around the medial aspect of the knee. The linear calcifications seen in the medial compartment of the knee. IMPRESSION: No acute osseous abnormality. Soft tissue swelling seen around the medial knee with a small knee joint effusion. 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 Electronically Signed   By: Jonna ClarkBindu  Avutu M.D.   On: 07/26/2019 21:57   Dg Hip Port Unilat With Pelvis 1v Left  Result Date: 07/26/2019 CLINICAL DATA:  Left hip pain following a fall out of a tree. EXAM: DG HIP (WITH OR WITHOUT PELVIS) 1V PORT LEFT COMPARISON:  None. FINDINGS: Left femoral neck fracture with mild proximal displacement of the distal fragment and mild varus angulation. No other fractures seen. IMPRESSION: Left femoral neck fracture, as described above. Electronically Signed   By: Beckie SaltsSteven  Reid M.D.   On: 07/26/2019 20:41    ROS: As above Blood pressure (!) 170/80, pulse 89, temperature 36.7 C, temperature source Oral, resp. rate 16, height 5\' 10"  (1.778 m), weight 83 kg, SpO2 97 %. Estimated body mass index is 26.26 kg/m as calculated from the following:   Height as of this encounter: 5\' 10"  (1.778 m).   Weight as of this encounter: 83 kg.  Physical Exam  General: An alert and pleasant 58 year old white male in no apparent distress  HEENT: He has a facial abrasion.  His pupils are equal round reactive light, extraocular muscles are intact  Neck: Supple without masses or deformities.  Spurling's testing is negative.  Thorax: Symmetric  Abdomen: Soft  Extremities he has orthosis in his bilateral upper  extremity and his left leg is in traction.  Neurologic exam: The patient is alert and oriented x3.  Glasgow Coma Scale 15.  Cranial nerves II through XII were examined bilaterally and grossly normal.  Vision and hearing are grossly normal.  The patient's motor strength is difficult to accurately assess because of his bilateral upper extremity orthosis and left lower extremity traction.  His strength is grossly normal in his right gastrocnemius and dorsiflexors.  He moves his fingers well bilaterally.  Sensory function is intact to light touch sensation all tested dermatomes bilaterally.  Cerebellar function is intact to rapid altering movements of the upper extremities bilaterally.  Imaging studies:  I reviewed the patient's head and cervical CT performed yesterday.  They are unremarkable  I reviewed the patient's CT of the chest abdomen pelvis only as it pertains to his spine.  He has left L1, L2 and L3 transverse process fractures.  Assessment/Plan: Lumbar transverse process fractures: These are the least of his problems.  They will heal without intervention.  Orthosis is not required for his lumbar fractures.  I will sign off.  Please call if I can be of further assistance.  Cristi LoronJeffrey D Reise Gladney 07/27/2019, 7:50 AM

## 2019-07-27 NOTE — Anesthesia Preprocedure Evaluation (Signed)
Anesthesia Evaluation  Patient identified by MRN, date of birth, ID band Patient awake    Reviewed: Allergy & Precautions, NPO status , Patient's Chart, lab work & pertinent test results  Airway Mallampati: II  TM Distance: >3 FB Neck ROM: Full    Dental no notable dental hx.    Pulmonary neg pulmonary ROS,    Pulmonary exam normal breath sounds clear to auscultation       Cardiovascular negative cardio ROS Normal cardiovascular exam Rhythm:Regular Rate:Normal     Neuro/Psych negative neurological ROS  negative psych ROS   GI/Hepatic negative GI ROS, Neg liver ROS,   Endo/Other  negative endocrine ROS  Renal/GU negative Renal ROS  negative genitourinary   Musculoskeletal negative musculoskeletal ROS (+)   Abdominal   Peds negative pediatric ROS (+)  Hematology negative hematology ROS (+)   Anesthesia Other Findings   Reproductive/Obstetrics negative OB ROS                             Anesthesia Physical Anesthesia Plan  ASA: II  Anesthesia Plan: General   Post-op Pain Management:    Induction: Intravenous  PONV Risk Score and Plan: 2 and Ondansetron, Dexamethasone and Midazolam  Airway Management Planned: Oral ETT  Additional Equipment: None  Intra-op Plan:   Post-operative Plan: Extubation in OR  Informed Consent: I have reviewed the patients History and Physical, chart, labs and discussed the procedure including the risks, benefits and alternatives for the proposed anesthesia with the patient or authorized representative who has indicated his/her understanding and acceptance.     Dental advisory given  Plan Discussed with: CRNA  Anesthesia Plan Comments:         Anesthesia Quick Evaluation

## 2019-07-27 NOTE — Progress Notes (Signed)
Orthopedic Tech Progress Note Patient Details:  Robert Hahn 1961/05/26 [id number]  Musculoskeletal Traction Type of Traction: Bucks Skin Traction Traction Location: lle Traction Weight: 10 lbs   Post Interventions Patient Tolerated: Well Instructions Provided: Care of device, Adjustment of device   Karolee Stamps 07/27/2019, 3:43 AM

## 2019-07-27 NOTE — Progress Notes (Signed)
Subjective/Chief Complaint: Pain control is OK. Feels anxious.   Objective: Vital signs in last 24 hours: Temp:  [98 F (36.7 C)-98.3 F (36.8 C)] 98.3 F (36.8 C) (09/07 0802) Pulse Rate:  [89-109] 89 (09/07 0802) Resp:  [14-25] 16 (09/07 0802) BP: (150-182)/(68-138) 182/71 (09/07 0802) SpO2:  [95 %-100 %] 100 % (09/07 0802) Weight:  [83 kg] 83 kg (09/06 2013)    Intake/Output from previous day: 09/06 0701 - 09/07 0700 In: 2540 [P.O.:240; I.V.:200; IV Piggyback:2100] Out: 250 [Urine:250] Intake/Output this shift: No intake/output data recorded.  General appearance: alert and cooperative Resp: clear to auscultation bilaterally GI: soft nontender, nondistended Extremities: splints to BUE, NVI. LLE in traction, NVI Pulses: 2+ and symmetric Neurologic: Grossly normal  Lab Results:  Recent Labs    07/26/19 2001 07/26/19 2007 07/27/19 0521  WBC 21.0*  --  13.9*  HGB 16.4 16.0 16.1  HCT 48.6 47.0 47.3  PLT 226  --  167   BMET Recent Labs    07/26/19 2001 07/26/19 2007 07/27/19 0521  NA 137 141 141  K 2.8* 2.9* 4.2  CL 106 108 109  CO2 15*  --  20*  GLUCOSE 191* 186* 138*  BUN 19 19 15   CREATININE 1.26* 1.10 1.02  CALCIUM 8.9  --  8.8*   PT/INR Recent Labs    07/26/19 2001  LABPROT 13.7  INR 1.1   ABG No results for input(s): PHART, HCO3 in the last 72 hours.  Invalid input(s): PCO2, PO2  Studies/Results: Dg Shoulder Right  Result Date: 07/26/2019 CLINICAL DATA:  Right shoulder pain EXAM: RIGHT SHOULDER - 2+ VIEW COMPARISON:  None. FINDINGS: There is no evidence of fracture or dislocation. Visualized portion of the lung is clear. Soft tissues are unremarkable. IMPRESSION: No acute osseous abnormality. Electronically Signed   By: Jonna Clark M.D.   On: 07/26/2019 21:56   Dg Elbow 2 Views Right  Result Date: 07/27/2019 CLINICAL DATA:  Postreduction EXAM: RIGHT ELBOW - 2 VIEW COMPARISON:  None. FINDINGS: There is been interval reduction posterior  dislocation the in near anatomic. Ossific fragments are seen adjacent to the medial. Surrounding soft tissue swelling. IMPRESSION: Interval reduction the elbow dislocation in near anatomic alignment. Medial epicondyle fracture. Electronically Signed   By: Jonna Clark M.D.   On: 07/27/2019 01:51   Dg Elbow Complete Right  Result Date: 07/26/2019 CLINICAL DATA:  Pain after fall EXAM: RIGHT ELBOW - COMPLETE 3+ VIEW COMPARISON:  None. FINDINGS: There is a 1.1 cm osseous fragment seen adjacent to the medial epicondyle, likely fracture from the medial epicondyle. There is complete posterior dislocation of the radius and ulna at the elbow. There is also a tiny osseous fragment seen adjacent to the capitellar surface of indeterminate origin. Significant surrounding soft tissue swelling and elbow joint effusion is seen. IMPRESSION: Osseous fragment adjacent to the medial epicondyle, likely medial epicondylar fracture. Complete posterior dislocation at the elbow. The osseous fragment seen adjacent to the capitellum of indeterminate origin. Significant surrounding soft tissue swelling and elbow joint effusion. Electronically Signed   By: Jonna Clark M.D.   On: 07/26/2019 21:59   Dg Wrist 2 Views Left  Result Date: 07/27/2019 CLINICAL DATA:  Postreduction EXAM: LEFT WRIST - 2 VIEW COMPARISON:  None. FINDINGS: There is been interval reduction of the lunate dislocation and near anatomic. There is comminuted radial styloid fracture. Soft tissue swelling. IMPRESSION: Interval reduction lunate dislocation in near anatomic alignment. Radial styloid fracture. Electronically Signed   By: Kandis Fantasia  Avutu M.D.   On: 07/27/2019 01:52   Dg Wrist 2 Views Right  Result Date: 07/27/2019 CLINICAL DATA:  Pain after fall EXAM: RIGHT WRIST - 2 VIEW COMPARISON:  None. FINDINGS: Overlying material obscures fine bony detail. Definite acute fracture seen. Radioulnar joint appears to be intact. Overlying soft tissue. IMPRESSION: No acute  osseous abnormality. Electronically Signed   By: Prudencio Pair M.D.   On: 07/27/2019 01:54   Dg Wrist Complete Left  Result Date: 07/26/2019 CLINICAL DATA:  Fall EXAM: LEFT WRIST - COMPLETE 3+ VIEW COMPARISON:  None. FINDINGS: There is a complete anterior volar dislocation of the lunate. There is superior migration of the radius with volar angulation. There is comminuted fracture seen of the distal radius and ulnar styloid with small fracture fragments seen along the radial surface. There is ulnar subluxation of the distal ulna. Significant surrounding soft tissue swelling is seen. There is a mild metallic foreign body seen overlying the fifth metacarpal head. IMPRESSION: Complete anterior volar dislocation of the lunate. Comminuted fracture of the distal radius with superior impaction of the distal radius. Electronically Signed   By: Prudencio Pair M.D.   On: 07/26/2019 21:55   Ct Head Wo Contrast  Result Date: 07/26/2019 CLINICAL DATA:  Fall from tree. EXAM: CT HEAD WITHOUT CONTRAST CT CERVICAL SPINE WITHOUT CONTRAST TECHNIQUE: Multidetector CT imaging of the head and cervical spine was performed following the standard protocol without intravenous contrast. Multiplanar CT image reconstructions of the cervical spine were also generated. COMPARISON:  None. FINDINGS: CT HEAD FINDINGS Brain: No acute intracranial abnormality. Specifically, no hemorrhage, hydrocephalus, mass lesion, acute infarction, or significant intracranial injury. Vascular: No hyperdense vessel or unexpected calcification. Skull: No acute calvarial abnormality. Sinuses/Orbits: Visualized paranasal sinuses and mastoids clear. Orbital soft tissues unremarkable. Other: None CT CERVICAL SPINE FINDINGS Alignment: Normal Skull base and vertebrae: No acute fracture. No primary bone lesion or focal pathologic process. Soft tissues and spinal canal: No prevertebral fluid or swelling. No visible canal hematoma. Disc levels: Degenerative disc disease at  C4-5 with disc space narrowing and spurring. Upper chest: Posterior right 1st rib fracture noted. There appears to be a right effusion partially imaged. See chest CT report for full discussion. Other: None IMPRESSION: No acute intracranial abnormality. Fracture through the posterior right first rib. Probable right pleural effusion. No acute bony abnormality in the cervical spine. Electronically Signed   By: Rolm Baptise M.D.   On: 07/26/2019 21:05   Ct Chest W Contrast  Addendum Date: 07/26/2019   ADDENDUM REPORT: 07/26/2019 21:27 ADDENDUM: A minimally displaced proximal right first rib fracture is present. No additional rib fractures are present. Electronically Signed   By: San Morelle M.D.   On: 07/26/2019 21:27   Result Date: 07/26/2019 CLINICAL DATA:  Abd trauma, blunt, stable. Hit by tree branch. Fell from tree. EXAM: CT CHEST, ABDOMEN, AND PELVIS WITH CONTRAST TECHNIQUE: Multidetector CT imaging of the chest, abdomen and pelvis was performed following the standard protocol during bolus administration of intravenous contrast. CONTRAST:  156mL OMNIPAQUE IOHEXOL 300 MG/ML  SOLN COMPARISON:  None. FINDINGS: CT CHEST FINDINGS Cardiovascular: Heart size is normal. In aorta and great vessel origins are within normal limits. Pulmonary arteries are normal. There is no significant pericardial effusion or trauma. Mediastinum/Nodes: Significant mediastinal, hilar, or axillary adenopathy is present. There is no significant mediastinal effusion or air. Lungs/Pleura: Minimal dependent atelectasis is present. Lungs are otherwise clear without focal nodule, mass, or airspace disease. Is no pulmonary contusion. No pneumothorax is  present. No significant pleural effusions are present. Musculoskeletal: 12 rib-bearing thoracic type vertebral bodies are present. No acute or healing fractures are present. Ribs are unremarkable. CT ABDOMEN PELVIS FINDINGS Hepatobiliary: Hemorrhage is present along the posterior margin of  the liver. No focal hepatic contusion is present. The common bile duct and gallbladder normal. Pancreas: Unremarkable. No pancreatic ductal dilatation or surrounding inflammatory changes. Spleen: No splenic injury or perisplenic hematoma. Adrenals/Urinary Tract: There is diffuse hemorrhage about the right adrenal gland. Focal collection is present. Left adrenal gland is normal. Simple cyst in the left kidney measures 5.3 x 4.1 x 4.3 cm. Kidneys and ureters are otherwise within normal limits bilaterally. The urinary bladder is within normal limits. Stomach/Bowel: Stomach and duodenum are within normal limits. Small bowel is unremarkable. Terminal ileum is normal. The appendix is visualized and normal. The ascending and transverse colon are normal. Descending and sigmoid colon are within normal limits. Vascular/Lymphatic: Scattered atherosclerotic calcifications are present. There is no aneurysm. No significant retroperitoneal adenopathy is present. Reproductive: Prostate is unremarkable. Other: No abdominal wall hernia or abnormality. No abdominopelvic ascites. Musculoskeletal: Left transverse process fractures are present at L1, L2, and L3. Fracture fragments are displaced posteriorly 1 shaft width. Vertebral body heights are maintained. No additional fractures present in the spine. Sacrum is intact. A comminuted left femoral neck fracture is present. Varus angulation is noted. Right hip is located. Left acetabulum is intact. IMPRESSION: 1. Right adrenal hemorrhage. 2. Hemorrhage extending into Morrison's pouch and about the free edge of the liver posteriorly. No definite hemorrhagic contusion is present. 3. Left L1, L2, and L3 transverse process fractures 4. Comminuted left femoral neck fracture. Electronically Signed: By: Marin Roberts M.D. On: 07/26/2019 21:18   Ct Cervical Spine Wo Contrast  Result Date: 07/26/2019 CLINICAL DATA:  Fall from tree. EXAM: CT HEAD WITHOUT CONTRAST CT CERVICAL SPINE WITHOUT  CONTRAST TECHNIQUE: Multidetector CT imaging of the head and cervical spine was performed following the standard protocol without intravenous contrast. Multiplanar CT image reconstructions of the cervical spine were also generated. COMPARISON:  None. FINDINGS: CT HEAD FINDINGS Brain: No acute intracranial abnormality. Specifically, no hemorrhage, hydrocephalus, mass lesion, acute infarction, or significant intracranial injury. Vascular: No hyperdense vessel or unexpected calcification. Skull: No acute calvarial abnormality. Sinuses/Orbits: Visualized paranasal sinuses and mastoids clear. Orbital soft tissues unremarkable. Other: None CT CERVICAL SPINE FINDINGS Alignment: Normal Skull base and vertebrae: No acute fracture. No primary bone lesion or focal pathologic process. Soft tissues and spinal canal: No prevertebral fluid or swelling. No visible canal hematoma. Disc levels: Degenerative disc disease at C4-5 with disc space narrowing and spurring. Upper chest: Posterior right 1st rib fracture noted. There appears to be a right effusion partially imaged. See chest CT report for full discussion. Other: None IMPRESSION: No acute intracranial abnormality. Fracture through the posterior right first rib. Probable right pleural effusion. No acute bony abnormality in the cervical spine. Electronically Signed   By: Charlett Nose M.D.   On: 07/26/2019 21:05   Ct Abdomen Pelvis W Contrast  Addendum Date: 07/26/2019   ADDENDUM REPORT: 07/26/2019 21:27 ADDENDUM: A minimally displaced proximal right first rib fracture is present. No additional rib fractures are present. Electronically Signed   By: Marin Roberts M.D.   On: 07/26/2019 21:27   Result Date: 07/26/2019 CLINICAL DATA:  Abd trauma, blunt, stable. Hit by tree branch. Fell from tree. EXAM: CT CHEST, ABDOMEN, AND PELVIS WITH CONTRAST TECHNIQUE: Multidetector CT imaging of the chest, abdomen and pelvis was performed  following the standard protocol during bolus  administration of intravenous contrast. CONTRAST:  100mL OMNIPAQUE IOHEXOL 300 MG/ML  SOLN COMPARISON:  None. FINDINGS: CT CHEST FINDINGS Cardiovascular: Heart size is normal. In aorta and great vessel origins are within normal limits. Pulmonary arteries are normal. There is no significant pericardial effusion or trauma. Mediastinum/Nodes: Significant mediastinal, hilar, or axillary adenopathy is present. There is no significant mediastinal effusion or air. Lungs/Pleura: Minimal dependent atelectasis is present. Lungs are otherwise clear without focal nodule, mass, or airspace disease. Is no pulmonary contusion. No pneumothorax is present. No significant pleural effusions are present. Musculoskeletal: 12 rib-bearing thoracic type vertebral bodies are present. No acute or healing fractures are present. Ribs are unremarkable. CT ABDOMEN PELVIS FINDINGS Hepatobiliary: Hemorrhage is present along the posterior margin of the liver. No focal hepatic contusion is present. The common bile duct and gallbladder normal. Pancreas: Unremarkable. No pancreatic ductal dilatation or surrounding inflammatory changes. Spleen: No splenic injury or perisplenic hematoma. Adrenals/Urinary Tract: There is diffuse hemorrhage about the right adrenal gland. Focal collection is present. Left adrenal gland is normal. Simple cyst in the left kidney measures 5.3 x 4.1 x 4.3 cm. Kidneys and ureters are otherwise within normal limits bilaterally. The urinary bladder is within normal limits. Stomach/Bowel: Stomach and duodenum are within normal limits. Small bowel is unremarkable. Terminal ileum is normal. The appendix is visualized and normal. The ascending and transverse colon are normal. Descending and sigmoid colon are within normal limits. Vascular/Lymphatic: Scattered atherosclerotic calcifications are present. There is no aneurysm. No significant retroperitoneal adenopathy is present. Reproductive: Prostate is unremarkable. Other: No  abdominal wall hernia or abnormality. No abdominopelvic ascites. Musculoskeletal: Left transverse process fractures are present at L1, L2, and L3. Fracture fragments are displaced posteriorly 1 shaft width. Vertebral body heights are maintained. No additional fractures present in the spine. Sacrum is intact. A comminuted left femoral neck fracture is present. Varus angulation is noted. Right hip is located. Left acetabulum is intact. IMPRESSION: 1. Right adrenal hemorrhage. 2. Hemorrhage extending into Morrison's pouch and about the free edge of the liver posteriorly. No definite hemorrhagic contusion is present. 3. Left L1, L2, and L3 transverse process fractures 4. Comminuted left femoral neck fracture. Electronically Signed: By: Marin Robertshristopher  Mattern M.D. On: 07/26/2019 21:18   Ct Wrist Left Wo Contrast  Result Date: 07/27/2019 CLINICAL DATA:  Evaluate left wrist EXAM: CT OF THE LEFT WRIST WITHOUT CONTRAST TECHNIQUE: Multidetector CT imaging was performed according to the standard protocol. Multiplanar CT image reconstructions were also generated. COMPARISON:  Radiograph same day FINDINGS: Bones/Joint/Cartilage There is been interval reduction the lunate dislocation. There is a tiny osseous fragments seen surrounding volar hand dorsal surface the lunate. There is comminuted intra-articular fracture seen radial styloid with small ossific fragments at the radius scaphoid joint space. The radioulnar joint appears fractures. There is diffuse soft tissue Ligaments Suboptimally assessed by CT. Muscles and Tendons The muscles and tendons to be grossly. Soft tissues Diffuse soft tissue swelling seen surrounding the wrist. IMPRESSION: 1. Comminuted intra-articular radial styloid fracture. 2. Interval reduction the tiny ossific fragments. Electronically Signed   By: Jonna ClarkBindu  Avutu M.D.   On: 07/27/2019 01:50   Ct Elbow Right Wo Contrast  Result Date: 07/27/2019 CLINICAL DATA:  Postreduction EXAM: CT OF THE LOWER RIGHT  EXTREMITY WITHOUT CONTRAST TECHNIQUE: Multidetector CT imaging of the right lower extremity was performed according to the standard protocol. COMPARISON:  None. FINDINGS: Bones/Joint/Cartilage There is been interval reduction of the posterior elbow dislocation. Slight posterior  tilt of the ulna at the trochlear surface. Intra-articular comminuted fracture medial epicondyle. Tiny nondisplaced fracture of the anterior coronoid. There is a moderate elbow effusion seen the radial does articulate the capitellum Ligaments Suboptimally assessed by CT. Muscles and Tendons The muscles appear to intact.  Visualization tendons Soft tissues Significant surrounding soft tissue posteriorly. IMPRESSION: Interval reduction of posterior elbow dislocation. There remains slight posterior tilt of the ulna at the trochlear surface. Comminuted fracture of the medial epicondyle and mildly tiny coronoid process fracture. Elbow joint effusion Electronically Signed   By: Jonna ClarkBindu  Avutu M.D.   On: 07/27/2019 02:02   Dg Chest Port 1 View  Result Date: 07/26/2019 CLINICAL DATA:  Fall out of tree. EXAM: PORTABLE CHEST 1 VIEW COMPARISON:  None. FINDINGS: The heart size and mediastinal contours are within normal limits. Both lungs are clear. The visualized skeletal structures are unremarkable. IMPRESSION: No active disease. Electronically Signed   By: Charlett NoseKevin  Dover M.D.   On: 07/26/2019 20:42   Dg Knee Complete 4 Views Left  Result Date: 07/26/2019 CLINICAL DATA:  Knee pain after fall EXAM: LEFT KNEE - COMPLETE 4+ VIEW COMPARISON:  None. FINDINGS: No fracture or dislocation. Small knee joint effusion is seen. Soft tissue swelling seen around the medial aspect of the knee. The linear calcifications seen in the medial compartment of the knee. IMPRESSION: No acute osseous abnormality. Soft tissue swelling seen around the medial knee with a small knee joint effusion. 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  Electronically Signed   By: Jonna ClarkBindu  Avutu M.D.   On: 07/26/2019 21:57   Dg Hip Port Unilat With Pelvis 1v Left  Result Date: 07/26/2019 CLINICAL DATA:  Left hip pain following a fall out of a tree. EXAM: DG HIP (WITH OR WITHOUT PELVIS) 1V PORT LEFT COMPARISON:  None. FINDINGS: Left femoral neck fracture with mild proximal displacement of the distal fragment and mild varus angulation. No other fractures seen. IMPRESSION: Left femoral neck fracture, as described above. Electronically Signed   By: Beckie SaltsSteven  Reid M.D.   On: 07/26/2019 20:41    Anti-infectives: Anti-infectives (From admission, onward)   None      Assessment/Plan: 58 year old male status post fall, 20 feet Right elbow fracture/dislocation Left wrist fx Left femoral neck fx fracture  Evaluated by Dr. Jena GaussHaddix. Closed reduction of right elbow and left wrist performed, underwent CT for surgical planning, and left lower extremity placed in traction. Dr. Jena GaussHaddix plans to consult dedicated hand surgeon / joint surgeon, possible THA tomorrow Lumbar transverse process fractures- Dr. Lovell SheehanJenkins- no treatment necessary Adrenal hemorrhage- no treatment indicated, follow hgb (stable). Dr. Annabell HowellsWrenn consulted Right posterior 1st rib fx- asymptomatic, repeat CXR in AM   LOS: 1 day    Berna BueChelsea A  07/27/2019

## 2019-07-27 NOTE — Consult Note (Signed)
Reason for Consult: Complex multitrauma right elbow and left wrist Referring Physician: Orthopedics  Robert Hahn is an 58 y.o. male.  HPI: Patient is a 58 year old male who presents after falling 20 feet.  I been asked to see and treat his upper extremities due to the fact that he has a very complex array of problems.  The patient had a fracture dislocation to his right elbow this was initially reduced in the ER by Dr. Jena GaussHaddix.  Post CT confirms subluxation and medial injury including a slight bit of the coronoid as well as what appears to be the medial epicondylar region.  The patient and I reviewed this.  I informed him of the issues.  The patient does have proprioception and light touch sensation but has some tingling in his hand on the right side.  He also states he has incredible pain here.   His left hand has sustained a lunate dislocation.  The lunate dislocation was reduced by Dr. Jena GaussHaddix as well last night.  Fortunately the median nerve appears to be intact at this time with intact light touch sensation.  Patient and I reviewed this at length and his findings.  He appears to have a very unstable pattern of dislocation about the affected left wrist.  In addition to this the patient also has a left hip fracture which will require total hip arthroplasty.  This will be directed by Dr. Linna CapriceSwinteck.  I reviewed his findings.  I spent a great deal of time greater than 45 minutes reviewing his scans to include plain film radiograph and CT scan of the right elbow and left wrist.   No past medical history on file.    No family history on file.  Social History:  has no history on file for tobacco, alcohol, and drug.  Allergies: No Known Allergies  Medications: I have reviewed the patient's current medications.  Results for orders placed or performed during the hospital encounter of 07/26/19 (from the past 48 hour(s))  CDS serology     Status: None   Collection Time: 07/26/19  8:01 PM   Result Value Ref Range   CDS serology specimen      SPECIMEN WILL BE HELD FOR 14 DAYS IF TESTING IS REQUIRED    Comment: Performed at [facility], [address] [place], [place] [address]  Comprehensive metabolic panel     Status: Abnormal   Collection Time: 07/26/19  8:01 PM  Result Value Ref Range   Sodium 137 135 - 145 mmol/L   Potassium 2.8 (L) 3.5 - 5.1 mmol/L   Chloride 106 98 - 111 mmol/L   CO2 15 (L) 22 - 32 mmol/L   Glucose, Bld 191 (H) 70 - 99 mg/dL   BUN 19 6 - 20 mg/dL   Creatinine, Ser 9.141.26 (H) 0.61 - 1.24 mg/dL   Calcium 8.9 8.9 - 78.210.3 mg/dL   Total Protein 6.1 (L) 6.5 - 8.1 g/dL   Albumin 3.9 3.5 - 5.0 g/dL   AST 956106 (H) 15 - 41 U/L   ALT 111 (H) 0 - 44 U/L   Alkaline Phosphatase 56 38 - 126 U/L   Total Bilirubin 0.7 0.3 - 1.2 mg/dL   GFR calc non Af Amer >60 >60 mL/min   GFR calc Af Amer >60 >60 mL/min   Anion gap 16 (H) 5 - 15    Comment: Performed at [facility], [address] [place], [place] [address]  CBC     Status:  Abnormal   Collection Time: 07/26/19  8:01 PM  Result Value Ref Range   WBC 21.0 (H) 4.0 - 10.5 K/uL   RBC 5.65 4.22 - 5.81 MIL/uL   Hemoglobin 16.4 13.0 - 17.0 g/dL   HCT 81.148.6 91.439.0 - 78.252.0 %   MCV 86.0 80.0 - 100.0 fL   MCH 29.0 26.0 - 34.0 pg   MCHC 33.7 30.0 - 36.0 g/dL   RDW 95.612.7 21.311.5 - 08.615.5 %   Platelets 226 150 - 400 K/uL   nRBC 0.0 0.0 - 0.2 %    Comment: Performed at [facility], [address] [place], [place] [address]  Ethanol     Status: None   Collection Time: 07/26/19  8:01 PM  Result Value Ref Range   Alcohol, Ethyl (B) <10 <10 mg/dL    Comment: (NOTE) Lowest detectable limit for serum alcohol is 10 mg/dL. For medical purposes only. Performed at [facility], [address] [place], [place] [address]   Lactic acid, plasma     Status: Abnormal   Collection Time: 07/26/19  8:01 PM  Result Value Ref Range   Lactic Acid, Venous 5.1 (HH) 0.5 - 1.9 mmol/L    Comment: CRITICAL RESULT  CALLED TO, READ BACK BY AND VERIFIED WITH: Marissa NestleSECORRO, J RN @ 2044 ON 07/26/2019 BY TEMOCHE, H Performed at [facility], [address] [place], [place] [address]   Protime-INR     Status: None   Collection Time: 07/26/19  8:01 PM  Result Value Ref Range   Prothrombin Time 13.7 11.4 - 15.2 seconds   INR 1.1 0.8 - 1.2    Comment: (NOTE) INR goal varies based on device and disease states. Performed at [facility], [address] [place], [place] [address]   Sample to Blood Bank     Status: None   Collection Time: 07/26/19  8:01 PM  Result Value Ref Range   Blood Bank Specimen SAMPLE AVAILABLE FOR TESTING    Sample Expiration      07/27/2019,2359 Performed at [facility], [address] [place], [place] [address]   I-stat chem 8, ED     Status: Abnormal   Collection Time: 07/26/19  8:07 PM  Result Value Ref Range   Sodium 141 135 - 145 mmol/L   Potassium 2.9 (L) 3.5 - 5.1 mmol/L   Chloride 108 98 - 111 mmol/L   BUN 19 6 - 20 mg/dL   Creatinine, Ser 3.661.10 0.61 - 1.24 mg/dL   Glucose, Bld 440186 (H) 70 - 99 mg/dL   Calcium, Ion 3.471.17 4.251.15 - 1.40 mmol/L   TCO2 17 (L) 22 - 32 mmol/L   Hemoglobin 16.0 13.0 - 17.0 g/dL   HCT 95.647.0 38.739.0 - 56.452.0 %  SARS Coronavirus 2 [facility](Hospital order, Performed in [facility] hospital lab) Nasopharyngeal Nasopharyngeal Swab     Status: None   Collection Time: 07/26/19  8:12 PM   Specimen: Nasopharyngeal Swab  Result Value Ref Range   SARS Coronavirus 2 NEGATIVE NEGATIVE    Comment: (NOTE) If result is NEGATIVE SARS-CoV-2 target nucleic acids are NOT DETECTED. The SARS-CoV-2 RNA is generally detectable in upper and lower  respiratory specimens during the acute phase of infection. The lowest  concentration of SARS-CoV-2 viral copies this assay can detect is 250  copies / mL. A negative result does not preclude SARS-CoV-2 infection  and should not be used as the sole basis for treatment or other  patient management decisions.  A  negative result may occur with  improper specimen collection / handling, submission of specimen other  than nasopharyngeal swab, presence of viral mutation(s) within the  areas targeted by this assay, and inadequate number of viral copies  (<250 copies / mL). A negative result must be combined with clinical  observations, patient history, and epidemiological information. If result is POSITIVE SARS-CoV-2 target nucleic acids are DETECTED. The SARS-CoV-2 RNA is generally detectable in upper and lower  respiratory specimens dur ing the acute phase of infection.  Positive  results are indicative of active infection with SARS-CoV-2.  Clinical  correlation with patient history and other diagnostic information is  necessary to determine patient infection status.  Positive results do  not rule out bacterial infection or co-infection with other viruses. If result is PRESUMPTIVE POSTIVE SARS-CoV-2 nucleic acids MAY BE PRESENT.   A presumptive positive result was obtained on the submitted specimen  and confirmed on repeat testing.  While 2019 novel coronavirus  (SARS-CoV-2) nucleic acids may be present in the submitted sample  additional confirmatory testing may be necessary for epidemiological  and / or clinical management purposes  to differentiate between  SARS-CoV-2 and other Sarbecovirus currently known to infect humans.  If clinically indicated additional testing with an alternate test  methodology [phone]) is advised. The SARS-CoV-2 RNA is generally  detectable in upper and lower respiratory sp ecimens during the acute  phase of infection. The expected result is Negative. Fact Sheet for Patients:  BoilerBrush.com.cy Fact Sheet for Healthcare Providers: https://pope.com/ This test is not yet approved or cleared by the [place] FDA and has been authorized for detection and/or diagnosis of SARS-CoV-2 by FDA under an Emergency Use  Authorization (EUA).  This EUA will remain in effect (meaning this test can be used) for the duration of the COVID-19 declaration under Section 564(b)(1) of the Act, 21 U.S.C. section 360bbb-3(b)(1), unless the authorization is terminated or revoked sooner. Performed at [facility], [address] [place], [place] [address]   Urinalysis, Routine w reflex microscopic     Status: Abnormal   Collection Time: 07/27/19 12:16 AM  Result Value Ref Range   Color, Urine STRAW (A) YELLOW   APPearance CLEAR CLEAR   Specific Gravity, Urine 1.027 1.005 - 1.030   pH 5.0 5.0 - 8.0   Glucose, UA 50 (A) NEGATIVE mg/dL   Hgb urine dipstick MODERATE (A) NEGATIVE   Bilirubin Urine NEGATIVE NEGATIVE   Ketones, ur 5 (A) NEGATIVE mg/dL   Protein, ur NEGATIVE NEGATIVE mg/dL   Nitrite NEGATIVE NEGATIVE   Leukocytes,Ua NEGATIVE NEGATIVE   RBC / HPF 6-10 0 - 5 RBC/hpf   WBC, UA 0-5 0 - 5 WBC/hpf   Bacteria, UA NONE SEEN NONE SEEN   Squamous Epithelial / LPF 0-5 0 - 5   Mucus PRESENT     Comment: Performed at [facility], [address], [place], [place] [address]  CBC     Status: Abnormal   Collection Time: 07/27/19  5:21 AM  Result Value Ref Range   WBC 13.9 (H) 4.0 - 10.5 K/uL   RBC 5.52 4.22 - 5.81 MIL/uL   Hemoglobin 16.1 13.0 - 17.0 g/dL   HCT 05.6 97.9 - 48.0 %   MCV 85.7 80.0 - 100.0 fL   MCH 29.2 26.0 - 34.0 pg   MCHC 34.0 30.0 - 36.0 g/dL   RDW 16.5 53.7 - 48.2 %   Platelets 167 150 -  400 K/uL   nRBC 0.0 0.0 - 0.2 %    Comment: Performed at [facility], [address], [place], [place] [address]  Basic metabolic panel     Status: Abnormal   Collection Time: 07/27/19  5:21 AM  Result Value Ref Range   Sodium 141 135 - 145 mmol/L   Potassium 4.2 3.5 - 5.1 mmol/L   Chloride 109 98 - 111 mmol/L   CO2 20 (L) 22 - 32 mmol/L   Glucose, Bld 138 (H) 70 - 99 mg/dL   BUN 15 6 - 20 mg/dL   Creatinine, Ser 6.04 0.61 - 1.24 mg/dL   Calcium 8.8 (L) 8.9 - 10.3 mg/dL   GFR  calc non Af Amer >60 >60 mL/min   GFR calc Af Amer >60 >60 mL/min   Anion gap 12 5 - 15    Comment: Performed at [facility], [address], [place], [place] [address]    Dg Shoulder Right  Result Date: 07/26/2019 CLINICAL DATA:  Right shoulder pain EXAM: RIGHT SHOULDER - 2+ VIEW COMPARISON:  None. FINDINGS: There is no evidence of fracture or dislocation. Visualized portion of the lung is clear. Soft tissues are unremarkable. IMPRESSION: No acute osseous abnormality. Electronically Signed   By: Jonna Clark M.D.   On: 07/26/2019 21:56   Dg Elbow 2 Views Right  Result Date: 07/27/2019 CLINICAL DATA:  Postreduction EXAM: RIGHT ELBOW - 2 VIEW COMPARISON:  None. FINDINGS: There is been interval reduction posterior dislocation the in near anatomic. Ossific fragments are seen adjacent to the medial. Surrounding soft tissue swelling. IMPRESSION: Interval reduction the elbow dislocation in near anatomic alignment. Medial epicondyle fracture. Electronically Signed   By: Jonna Clark M.D.   On: 07/27/2019 01:51   Dg Elbow Complete Right  Result Date: 07/26/2019 CLINICAL DATA:  Pain after fall EXAM: RIGHT ELBOW - COMPLETE 3+ VIEW COMPARISON:  None. FINDINGS: There is a 1.1 cm osseous fragment seen adjacent to the medial epicondyle, likely fracture from the medial epicondyle. There is complete posterior dislocation of the radius and ulna at the elbow. There is also a tiny osseous fragment seen adjacent to the capitellar surface of indeterminate origin. Significant surrounding soft tissue swelling and elbow joint effusion is seen. IMPRESSION: Osseous fragment adjacent to the medial epicondyle, likely medial epicondylar fracture. Complete posterior dislocation at the elbow. The osseous fragment seen adjacent to the capitellum of indeterminate origin. Significant surrounding soft tissue swelling and elbow joint effusion. Electronically Signed   By: Jonna Clark M.D.   On: 07/26/2019 21:59   Dg Wrist 2 Views  Left  Result Date: 07/27/2019 CLINICAL DATA:  Postreduction EXAM: LEFT WRIST - 2 VIEW COMPARISON:  None. FINDINGS: There is been interval reduction of the lunate dislocation and near anatomic. There is comminuted radial styloid fracture. Soft tissue swelling. IMPRESSION: Interval reduction lunate dislocation in near anatomic alignment. Radial styloid fracture. Electronically Signed   By: Jonna Clark M.D.   On: 07/27/2019 01:52   Dg Wrist 2 Views Right  Result Date: 07/27/2019 CLINICAL DATA:  Pain after fall EXAM: RIGHT WRIST - 2 VIEW COMPARISON:  None. FINDINGS: Overlying material obscures fine bony detail. Definite acute fracture seen. Radioulnar joint appears to be intact. Overlying soft tissue. IMPRESSION: No acute osseous abnormality. Electronically Signed   By: Jonna Clark M.D.   On: 07/27/2019 01:54   Dg Wrist Complete Left  Result Date: 07/26/2019 CLINICAL DATA:  Fall EXAM: LEFT WRIST - COMPLETE 3+ VIEW  COMPARISON:  None. FINDINGS: There is a complete anterior volar dislocation of the lunate. There is superior migration of the radius with volar angulation. There is comminuted fracture seen of the distal radius and ulnar styloid with small fracture fragments seen along the radial surface. There is ulnar subluxation of the distal ulna. Significant surrounding soft tissue swelling is seen. There is a mild metallic foreign body seen overlying the fifth metacarpal head. IMPRESSION: Complete anterior volar dislocation of the lunate. Comminuted fracture of the distal radius with superior impaction of the distal radius. Electronically Signed   By: Jonna Clark M.D.   On: 07/26/2019 21:55   Ct Head Wo Contrast  Result Date: 07/26/2019 CLINICAL DATA:  Fall from tree. EXAM: CT HEAD WITHOUT CONTRAST CT CERVICAL SPINE WITHOUT CONTRAST TECHNIQUE: Multidetector CT imaging of the head and cervical spine was performed following the standard protocol without intravenous contrast. Multiplanar CT image  reconstructions of the cervical spine were also generated. COMPARISON:  None. FINDINGS: CT HEAD FINDINGS Brain: No acute intracranial abnormality. Specifically, no hemorrhage, hydrocephalus, mass lesion, acute infarction, or significant intracranial injury. Vascular: No hyperdense vessel or unexpected calcification. Skull: No acute calvarial abnormality. Sinuses/Orbits: Visualized paranasal sinuses and mastoids clear. Orbital soft tissues unremarkable. Other: None CT CERVICAL SPINE FINDINGS Alignment: Normal Skull base and vertebrae: No acute fracture. No primary bone lesion or focal pathologic process. Soft tissues and spinal canal: No prevertebral fluid or swelling. No visible canal hematoma. Disc levels: Degenerative disc disease at C4-5 with disc space narrowing and spurring. Upper chest: Posterior right 1st rib fracture noted. There appears to be a right effusion partially imaged. See chest CT report for full discussion. Other: None IMPRESSION: No acute intracranial abnormality. Fracture through the posterior right first rib. Probable right pleural effusion. No acute bony abnormality in the cervical spine. Electronically Signed   By: Charlett Nose M.D.   On: 07/26/2019 21:05   Ct Chest W Contrast  Addendum Date: 07/26/2019   ADDENDUM REPORT: 07/26/2019 21:27 ADDENDUM: A minimally displaced proximal right first rib fracture is present. No additional rib fractures are present. Electronically Signed   By: Marin Roberts M.D.   On: 07/26/2019 21:27   Result Date: 07/26/2019 CLINICAL DATA:  Abd trauma, blunt, stable. Hit by tree branch. Fell from tree. EXAM: CT CHEST, ABDOMEN, AND PELVIS WITH CONTRAST TECHNIQUE: Multidetector CT imaging of the chest, abdomen and pelvis was performed following the standard protocol during bolus administration of intravenous contrast. CONTRAST:  OMNIPAQUE IOHEXOL 300 MG/ML  SOLN COMPARISON:  None. FINDINGS: CT CHEST FINDINGS Cardiovascular: Heart size is normal. In  aorta and great vessel origins are within normal limits. Pulmonary arteries are normal. There is no significant pericardial effusion or trauma. Mediastinum/Nodes: Significant mediastinal, hilar, or axillary adenopathy is present. There is no significant mediastinal effusion or air. Lungs/Pleura: Minimal dependent atelectasis is present. Lungs are otherwise clear without focal nodule, mass, or airspace disease. Is no pulmonary contusion. No pneumothorax is present. No significant pleural effusions are present. Musculoskeletal: 12 rib-bearing thoracic type vertebral bodies are present. No acute or healing fractures are present. Ribs are unremarkable. CT ABDOMEN PELVIS FINDINGS Hepatobiliary: Hemorrhage is present along the posterior margin of the liver. No focal hepatic contusion is present. The common bile duct and gallbladder normal. Pancreas: Unremarkable. No pancreatic ductal dilatation or surrounding inflammatory changes. Spleen: No splenic injury or perisplenic hematoma. Adrenals/Urinary Tract: There is diffuse hemorrhage about the right adrenal gland. Focal collection is present. Left adrenal gland is normal. Simple cyst  in the left kidney measures 5.3 x 4.1 x 4.3 cm. Kidneys and ureters are otherwise within normal limits bilaterally. The urinary bladder is within normal limits. Stomach/Bowel: Stomach and duodenum are within normal limits. Small bowel is unremarkable. Terminal ileum is normal. The appendix is visualized and normal. The ascending and transverse colon are normal. Descending and sigmoid colon are within normal limits. Vascular/Lymphatic: Scattered atherosclerotic calcifications are present. There is no aneurysm. No significant retroperitoneal adenopathy is present. Reproductive: Prostate is unremarkable. Other: No abdominal wall hernia or abnormality. No abdominopelvic ascites. Musculoskeletal: Left transverse process fractures are present at L1, L2, and L3. Fracture fragments are displaced  posteriorly 1 shaft width. Vertebral body heights are maintained. No additional fractures present in the spine. Sacrum is intact. A comminuted left femoral neck fracture is present. Varus angulation is noted. Right hip is located. Left acetabulum is intact. IMPRESSION: 1. Right adrenal hemorrhage. 2. Hemorrhage extending into Morrison's pouch and about the free edge of the liver posteriorly. No definite hemorrhagic contusion is present. 3. Left L1, L2, and L3 transverse process fractures 4. Comminuted left femoral neck fracture. Electronically Signed: By: Marin Roberts M.D. On: 07/26/2019 21:18   Ct Cervical Spine Wo Contrast  Result Date: 07/26/2019 CLINICAL DATA:  Fall from tree. EXAM: CT HEAD WITHOUT CONTRAST CT CERVICAL SPINE WITHOUT CONTRAST TECHNIQUE: Multidetector CT imaging of the head and cervical spine was performed following the standard protocol without intravenous contrast. Multiplanar CT image reconstructions of the cervical spine were also generated. COMPARISON:  None. FINDINGS: CT HEAD FINDINGS Brain: No acute intracranial abnormality. Specifically, no hemorrhage, hydrocephalus, mass lesion, acute infarction, or significant intracranial injury. Vascular: No hyperdense vessel or unexpected calcification. Skull: No acute calvarial abnormality. Sinuses/Orbits: Visualized paranasal sinuses and mastoids clear. Orbital soft tissues unremarkable. Other: None CT CERVICAL SPINE FINDINGS Alignment: Normal Skull base and vertebrae: No acute fracture. No primary bone lesion or focal pathologic process. Soft tissues and spinal canal: No prevertebral fluid or swelling. No visible canal hematoma. Disc levels: Degenerative disc disease at C4-5 with disc space narrowing and spurring. Upper chest: Posterior right 1st rib fracture noted. There appears to be a right effusion partially imaged. See chest CT report for full discussion. Other: None IMPRESSION: No acute intracranial abnormality. Fracture through  the posterior right first rib. Probable right pleural effusion. No acute bony abnormality in the cervical spine. Electronically Signed   By: Charlett Nose M.D.   On: 07/26/2019 21:05   Ct Abdomen Pelvis W Contrast  Addendum Date: 07/26/2019   ADDENDUM REPORT: 07/26/2019 21:27 ADDENDUM: A minimally displaced proximal right first rib fracture is present. No additional rib fractures are present. Electronically Signed   By: Marin Roberts M.D.   On: 07/26/2019 21:27   Result Date: 07/26/2019 CLINICAL DATA:  Abd trauma, blunt, stable. Hit by tree branch. Fell from tree. EXAM: CT CHEST, ABDOMEN, AND PELVIS WITH CONTRAST TECHNIQUE: Multidetector CT imaging of the chest, abdomen and pelvis was performed following the standard protocol during bolus administration of intravenous contrast. CONTRAST:  OMNIPAQUE IOHEXOL 300 MG/ML  SOLN COMPARISON:  None. FINDINGS: CT CHEST FINDINGS Cardiovascular: Heart size is normal. In aorta and great vessel origins are within normal limits. Pulmonary arteries are normal. There is no significant pericardial effusion or trauma. Mediastinum/Nodes: Significant mediastinal, hilar, or axillary adenopathy is present. There is no significant mediastinal effusion or air. Lungs/Pleura: Minimal dependent atelectasis is present. Lungs are otherwise clear without focal nodule, mass, or airspace disease. Is no pulmonary contusion. No pneumothorax  is present. No significant pleural effusions are present. Musculoskeletal: 12 rib-bearing thoracic type vertebral bodies are present. No acute or healing fractures are present. Ribs are unremarkable. CT ABDOMEN PELVIS FINDINGS Hepatobiliary: Hemorrhage is present along the posterior margin of the liver. No focal hepatic contusion is present. The common bile duct and gallbladder normal. Pancreas: Unremarkable. No pancreatic ductal dilatation or surrounding inflammatory changes. Spleen: No splenic injury or perisplenic hematoma. Adrenals/Urinary  Tract: There is diffuse hemorrhage about the right adrenal gland. Focal collection is present. Left adrenal gland is normal. Simple cyst in the left kidney measures 5.3 x 4.1 x 4.3 cm. Kidneys and ureters are otherwise within normal limits bilaterally. The urinary bladder is within normal limits. Stomach/Bowel: Stomach and duodenum are within normal limits. Small bowel is unremarkable. Terminal ileum is normal. The appendix is visualized and normal. The ascending and transverse colon are normal. Descending and sigmoid colon are within normal limits. Vascular/Lymphatic: Scattered atherosclerotic calcifications are present. There is no aneurysm. No significant retroperitoneal adenopathy is present. Reproductive: Prostate is unremarkable. Other: No abdominal wall hernia or abnormality. No abdominopelvic ascites. Musculoskeletal: Left transverse process fractures are present at L1, L2, and L3. Fracture fragments are displaced posteriorly 1 shaft width. Vertebral body heights are maintained. No additional fractures present in the spine. Sacrum is intact. A comminuted left femoral neck fracture is present. Varus angulation is noted. Right hip is located. Left acetabulum is intact. IMPRESSION: 1. Right adrenal hemorrhage. 2. Hemorrhage extending into Morrison's pouch and about the free edge of the liver posteriorly. No definite hemorrhagic contusion is present. 3. Left L1, L2, and L3 transverse process fractures 4. Comminuted left femoral neck fracture. Electronically Signed: By: Marin Roberts M.D. On: 07/26/2019 21:18   Ct Wrist Left Wo Contrast  Result Date: 07/27/2019 CLINICAL DATA:  Evaluate left wrist EXAM: CT OF THE LEFT WRIST WITHOUT CONTRAST TECHNIQUE: Multidetector CT imaging was performed according to the standard protocol. Multiplanar CT image reconstructions were also generated. COMPARISON:  Radiograph same day FINDINGS: Bones/Joint/Cartilage There is been interval reduction the lunate dislocation.  There is a tiny osseous fragments seen surrounding volar hand dorsal surface the lunate. There is comminuted intra-articular fracture seen radial styloid with small ossific fragments at the radius scaphoid joint space. The radioulnar joint appears fractures. There is diffuse soft tissue Ligaments Suboptimally assessed by CT. Muscles and Tendons The muscles and tendons to be grossly. Soft tissues Diffuse soft tissue swelling seen surrounding the wrist. IMPRESSION: 1. Comminuted intra-articular radial styloid fracture. 2. Interval reduction the tiny ossific fragments. Electronically Signed   By: Jonna Clark M.D.   On: 07/27/2019 01:50   Ct Elbow Right Wo Contrast  Result Date: 07/27/2019 CLINICAL DATA:  Postreduction EXAM: CT OF THE LOWER RIGHT EXTREMITY WITHOUT CONTRAST TECHNIQUE: Multidetector CT imaging of the right lower extremity was performed according to the standard protocol. COMPARISON:  None. FINDINGS: Bones/Joint/Cartilage There is been interval reduction of the posterior elbow dislocation. Slight posterior tilt of the ulna at the trochlear surface. Intra-articular comminuted fracture medial epicondyle. Tiny nondisplaced fracture of the anterior coronoid. There is a moderate elbow effusion seen the radial does articulate the capitellum Ligaments Suboptimally assessed by CT. Muscles and Tendons The muscles appear to intact.  Visualization tendons Soft tissues Significant surrounding soft tissue posteriorly. IMPRESSION: Interval reduction of posterior elbow dislocation. There remains slight posterior tilt of the ulna at the trochlear surface. Comminuted fracture of the medial epicondyle and mildly tiny coronoid process fracture. Elbow joint effusion Electronically Signed  By: Prudencio Pair M.D.   On: 07/27/2019 02:02   Dg Chest Port 1 View  Result Date: 07/26/2019 CLINICAL DATA:  Fall out of tree. EXAM: PORTABLE CHEST 1 VIEW COMPARISON:  None. FINDINGS: The heart size and mediastinal contours are  within normal limits. Both lungs are clear. The visualized skeletal structures are unremarkable. IMPRESSION: No active disease. Electronically Signed   By: Rolm Baptise M.D.   On: 07/26/2019 20:42   Dg Knee Complete 4 Views Left  Result Date: 07/26/2019 CLINICAL DATA:  Knee pain after fall EXAM: LEFT KNEE - COMPLETE 4+ VIEW COMPARISON:  None. FINDINGS: No fracture or dislocation. Small knee joint effusion is seen. Soft tissue swelling seen around the medial aspect of the knee. The linear calcifications seen in the medial compartment of the knee. IMPRESSION: No acute osseous abnormality. Soft tissue swelling seen around the medial knee with a small knee joint effusion. 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 Electronically Signed   By: Prudencio Pair M.D.   On: 07/26/2019 21:57   Dg Hip Port Unilat With Pelvis 1v Left  Result Date: 07/26/2019 CLINICAL DATA:  Left hip pain following a fall out of a tree. EXAM: DG HIP (WITH OR WITHOUT PELVIS) 1V PORT LEFT COMPARISON:  None. FINDINGS: Left femoral neck fracture with mild proximal displacement of the distal fragment and mild varus angulation. No other fractures seen. IMPRESSION: Left femoral neck fracture, as described above. Electronically Signed   By: Claudie Revering M.D.   On: 07/26/2019 20:41    Review of Systems  Respiratory: Negative.   Cardiovascular: Negative.    Blood pressure (!) 162/92, pulse 98, temperature 99.4 F (37.4 C), temperature source Oral, resp. rate 16, height 5\' 10"  (1.778 m), weight 83 kg, SpO2 97 %. Physical Exam patient is alert and oriented.  He endorses left wrist and right elbow pain.  He has sensation to the left hand and has no evidence of instability or infection about the fingers of course.  His wrist is in a bandage/sugar tong splint.  Shoulders are stable.  The patient and I reviewed this at length.  The present time he has a stable sugar tong on the left wrist.  The right elbow was  in a long-arm splint.  This is fairly stable at present time.  The patient has some tingling but overall has intact proprioception.  There is no signs of infection or compartment syndrome based upon his exam.  No pain with passive extension he can move both the right and left hand digits.  Once again I have reviewed his CT and plain film radiograph at great length today.  Lungs are clear at present time.  Heart regular rate.  He does have back pain and does have notable transverse process fractures.  The left L1-L2 and L3 have transverse process fractures.  In addition of this he has a comminuted femoral neck fracture.  There is some hemorrhage about the right adrenal region.  This is noted on the scans.  Assessment/Plan: #1 comminuted complex fracture dislocation right elbow unstable-we will plan for surgical stabilization.  I would outline a plan of open medial exposure with ulnar nerve release and following this a direct repair with internal brace technique similar to a [employer].  If he is stable at this juncture we will stop if he is not will go to the lateral side and stabilize the lateral ulnar collateral ligament as well.  I reviewed these issues  with him at length and the findings.    #2 lunate dislocation left wrist-patient will require stabilization with open carpal tunnel release.  I will plan for volar exposure with carpal tunnel release followed by reconstructive efforts about the extremity from a dorsal approach.  I discussed with him the risk and benefit profile and other issues germane to his predicament.  #3 left femoral neck fracture and associated L1 transverse process fractures about L1-L2 and L3.  Patient will undergo hip surgery by Dr. Linna Caprice in conjunction with my surgical reconstruction tomorrow  #4 adrenal hemorrhage/injury per trauma surgery #5 posterior right first rib fracture We are planning surgery for your upper extremity. The risk and benefits of  surgery to include risk of bleeding, infection, anesthesia,  damage to normal structures and failure of the surgery to accomplish its intended goals of relieving symptoms and restoring function have been discussed in detail. With this in mind we plan to proceed. I have specifically discussed with the patient the pre-and postoperative regime and the dos and don'ts and risk and benefits in great detail. Risk and benefits of surgery also include risk of dystrophy(CRPS), chronic nerve pain, failure of the healing process to go onto completion and other inherent risks of surgery The relavent the pathophysiology of the disease/injury process, as well as the alternatives for treatment and postoperative course of action has been discussed in great detail with the patient who desires to proceed.  We will do everything in our power to help you (the patient) restore function to the upper extremity. It is a pleasure to see this patient today.   Dionne Ano Arial Galligan III 07/27/2019, 5:28 PM

## 2019-07-27 NOTE — Progress Notes (Signed)
Orthopedic Tech Progress Note Patient Details:  Ezequias Lard Aug 10, 1961 [id number]  Ortho Devices Type of Ortho Device: Post (long arm) splint, Sugartong splint Ortho Device/Splint Location: assisted dr with Damien Fusi posterior long arm and lue plaster sugartong. both were applied post reduction. Ortho Device/Splint Interventions: Ordered, Application, Adjustment   Post Interventions Patient Tolerated: Well Instructions Provided: Care of device, Adjustment of device   Karolee Stamps 07/27/2019, 12:06 AM

## 2019-07-27 NOTE — Progress Notes (Signed)
OT Cancellation Note  Patient Details Name: Robert Hahn MRN: [id number] DOB: 09/01/1961   Cancelled Treatment:    Reason Eval/Treat Not Completed: Medical issues which prohibited therapy. Per chart review, pt in Buck's traction awaiting surgical intervention for LE. Will follow-up for OT evaluation post-op.  [place], OTR/L Acute Rehab Services Pager [phone] Office [phone]     Almon Register 07/27/2019, 7:49 AM

## 2019-07-27 NOTE — Progress Notes (Signed)
PT Cancellation Note  Patient Details Name: Robert Hahn MRN: [id number] DOB: [birth year]   Cancelled Treatment:    Reason Eval/Treat Not Completed: Patient not medically ready. Per chart review, pt in Buck's traction awaiting surgical intervention for LE. Will follow-up for PT evaluation post-op.  Mabeline Caras, PT, DPT Acute Rehabilitation Services  Pager [phone] Office [place] 07/27/2019, 7:45 AM

## 2019-07-28 ENCOUNTER — Encounter (HOSPITAL_COMMUNITY)
Admission: EM | Disposition: A | Discharge status: Home Health | Payer: Self-pay | Source: Other Acute Inpatient Hospital

## 2019-07-28 ENCOUNTER — Inpatient Hospital Stay (HOSPITAL_COMMUNITY): Payer: BC Managed Care – PPO

## 2019-07-28 ENCOUNTER — Inpatient Hospital Stay (HOSPITAL_COMMUNITY): Payer: BC Managed Care – PPO | Admitting: Certified Registered Nurse Anesthetist

## 2019-07-28 ENCOUNTER — Encounter (HOSPITAL_COMMUNITY): Payer: Self-pay

## 2019-07-28 HISTORY — PX: ORIF ELBOW FRACTURE: SHX5031

## 2019-07-28 HISTORY — PX: CARPAL TUNNEL RELEASE: SHX101

## 2019-07-28 HISTORY — PX: TOTAL HIP ARTHROPLASTY: SHX124

## 2019-07-28 HISTORY — PX: ORIF WRIST FRACTURE: SHX2133

## 2019-07-28 LAB — CBC
HCT: 42.1 % (ref 39.0–52.0)
Hemoglobin: 14.8 g/dL (ref 13.0–17.0)
MCH: 29.4 pg (ref 26.0–34.0)
MCHC: 35.2 g/dL (ref 30.0–36.0)
MCV: 83.7 fL (ref 80.0–100.0)
Platelets: 154 10*3/uL (ref 150–400)
RBC: 5.03 MIL/uL (ref 4.22–5.81)
RDW: 13.1 % (ref 11.5–15.5)
WBC: 10.1 10*3/uL (ref 4.0–10.5)
nRBC: 0 % (ref 0.0–0.2)

## 2019-07-28 LAB — BASIC METABOLIC PANEL
Anion gap: 10 (ref 5–15)
BUN: 10 mg/dL (ref 6–20)
CO2: 21 mmol/L — ABNORMAL LOW (ref 22–32)
Calcium: 8.8 mg/dL — ABNORMAL LOW (ref 8.9–10.3)
Chloride: 106 mmol/L (ref 98–111)
Creatinine, Ser: 1.05 mg/dL (ref 0.61–1.24)
GFR calc Af Amer: >60 mL/min (ref >60)
GFR calc non Af Amer: >60 mL/min (ref >60)
Glucose, Bld: 145 mg/dL — ABNORMAL HIGH (ref 70–99)
Potassium: 3.3 mmol/L — ABNORMAL LOW (ref 3.5–5.1)
Sodium: 137 mmol/L (ref 135–145)

## 2019-07-28 LAB — SURGICAL PCR SCREEN
MRSA, PCR: NEGATIVE
Staphylococcus aureus: NEGATIVE

## 2019-07-28 LAB — HIV ANTIBODY (ROUTINE TESTING W REFLEX): HIV Screen 4th Generation wRfx: NONREACTIVE

## 2019-07-28 SURGERY — ARTHROPLASTY, HIP, TOTAL, ANTERIOR APPROACH
Anesthesia: General | Site: Wrist | Laterality: Right

## 2019-07-28 MED ORDER — ONDANSETRON HCL 4 MG/2ML IJ SOLN
INTRAMUSCULAR | Status: DC | PRN
  Administered 2019-07-28: 4 mg via INTRAVENOUS

## 2019-07-28 MED ORDER — MENTHOL 3 MG MT LOZG: 1.0000 | LOZENGE | OROMUCOSAL | Status: DC | PRN

## 2019-07-28 MED ORDER — SUGAMMADEX SODIUM 200 MG/2ML IV SOLN
INTRAVENOUS | Status: DC | PRN
  Administered 2019-07-28: 180 mg via INTRAVENOUS

## 2019-07-28 MED ORDER — ENOXAPARIN SODIUM 40 MG/0.4ML ~~LOC~~ SOLN
40.0000 mg | SUBCUTANEOUS | Status: DC
  Administered 2019-07-29 – 2019-07-30 (×2): 40 mg via SUBCUTANEOUS
  Filled 2019-07-28 (×2): qty 0.4

## 2019-07-28 MED ORDER — BUPIVACAINE HCL (PF) 0.25 % IJ SOLN
INTRAMUSCULAR | Status: AC
  Filled 2019-07-28: qty 30

## 2019-07-28 MED ORDER — BUPIVACAINE HCL (PF) 0.5 % IJ SOLN
INTRAMUSCULAR | Status: AC
  Filled 2019-07-28: qty 30

## 2019-07-28 MED ORDER — ONDANSETRON HCL 4 MG/2ML IJ SOLN
INTRAMUSCULAR | Status: AC
  Filled 2019-07-28: qty 2

## 2019-07-28 MED ORDER — POTASSIUM CHLORIDE 10 MEQ/100ML IV SOLN
10.0000 meq | INTRAVENOUS | Status: DC
  Administered 2019-07-28: 10:00:00 10 meq via INTRAVENOUS
  Filled 2019-07-28: qty 100

## 2019-07-28 MED ORDER — ONDANSETRON HCL 4 MG/2ML IJ SOLN: 4.0000 mg | Freq: Four times a day (QID) | INTRAMUSCULAR | Status: DC | PRN

## 2019-07-28 MED ORDER — SODIUM CHLORIDE (PF) 0.9 % IJ SOLN
INTRAMUSCULAR | Status: DC | PRN
  Administered 2019-07-28: 30 mL

## 2019-07-28 MED ORDER — DEXAMETHASONE SODIUM PHOSPHATE 10 MG/ML IJ SOLN
INTRAMUSCULAR | Status: DC | PRN
  Administered 2019-07-28: 4 mg via INTRAVENOUS

## 2019-07-28 MED ORDER — HYDROMORPHONE HCL 1 MG/ML IJ SOLN
INTRAMUSCULAR | Status: DC | PRN
  Administered 2019-07-28 (×2): 0.5 mg via INTRAVENOUS

## 2019-07-28 MED ORDER — HYDROMORPHONE HCL 1 MG/ML IJ SOLN: 0.2500 mg | INTRAMUSCULAR | Status: DC | PRN

## 2019-07-28 MED ORDER — KETOROLAC TROMETHAMINE 30 MG/ML IJ SOLN: 30.0000 mg | Freq: Once | INTRAMUSCULAR | Status: DC | PRN

## 2019-07-28 MED ORDER — KETAMINE HCL 50 MG/5ML IJ SOSY
PREFILLED_SYRINGE | INTRAMUSCULAR | Status: AC
  Filled 2019-07-28: qty 5

## 2019-07-28 MED ORDER — ACETAMINOPHEN 10 MG/ML IV SOLN
INTRAVENOUS | Status: DC | PRN
  Administered 2019-07-28: 1000 mg via INTRAVENOUS

## 2019-07-28 MED ORDER — ROCURONIUM BROMIDE 10 MG/ML (PF) SYRINGE
PREFILLED_SYRINGE | INTRAVENOUS | Status: AC
  Filled 2019-07-28: qty 10

## 2019-07-28 MED ORDER — PHENYLEPHRINE 40 MCG/ML (10ML) SYRINGE FOR IV PUSH (FOR BLOOD PRESSURE SUPPORT)
PREFILLED_SYRINGE | INTRAVENOUS | Status: DC | PRN
  Administered 2019-07-28 (×3): 80 ug via INTRAVENOUS

## 2019-07-28 MED ORDER — FENTANYL CITRATE (PF) 250 MCG/5ML IJ SOLN
INTRAMUSCULAR | Status: DC | PRN
  Administered 2019-07-28: 25 ug via INTRAVENOUS
  Administered 2019-07-28: 150 ug via INTRAVENOUS
  Administered 2019-07-28 (×3): 25 ug via INTRAVENOUS

## 2019-07-28 MED ORDER — MIDAZOLAM HCL 2 MG/2ML IJ SOLN
INTRAMUSCULAR | Status: AC
  Filled 2019-07-28: qty 2

## 2019-07-28 MED ORDER — FENTANYL CITRATE (PF) 250 MCG/5ML IJ SOLN
INTRAMUSCULAR | Status: AC
  Filled 2019-07-28: qty 5

## 2019-07-28 MED ORDER — HYDROMORPHONE HCL 1 MG/ML IJ SOLN
INTRAMUSCULAR | Status: AC
  Filled 2019-07-28: qty 0.5

## 2019-07-28 MED ORDER — ROCURONIUM BROMIDE 10 MG/ML (PF) SYRINGE
PREFILLED_SYRINGE | INTRAVENOUS | Status: DC | PRN
  Administered 2019-07-28: 30 mg via INTRAVENOUS
  Administered 2019-07-28: 50 mg via INTRAVENOUS
  Administered 2019-07-28: 30 mg via INTRAVENOUS
  Administered 2019-07-28: 20 mg via INTRAVENOUS
  Administered 2019-07-28: 30 mg via INTRAVENOUS

## 2019-07-28 MED ORDER — OXYCODONE HCL 5 MG PO TABS: 5.0000 mg | ORAL_TABLET | Freq: Once | ORAL | Status: DC | PRN

## 2019-07-28 MED ORDER — ONDANSETRON HCL 4 MG PO TABS: 4.0000 mg | ORAL_TABLET | Freq: Four times a day (QID) | ORAL | Status: DC | PRN

## 2019-07-28 MED ORDER — PROPOFOL 10 MG/ML IV BOLUS
INTRAVENOUS | Status: AC
  Filled 2019-07-28: qty 20

## 2019-07-28 MED ORDER — ACETAMINOPHEN 500 MG PO TABS
1000.0000 mg | ORAL_TABLET | Freq: Once | ORAL | Status: AC
  Administered 2019-07-28: 1000 mg via ORAL

## 2019-07-28 MED ORDER — 0.9 % SODIUM CHLORIDE (POUR BTL) OPTIME
TOPICAL | Status: DC | PRN
  Administered 2019-07-28 (×3): 1000 mL

## 2019-07-28 MED ORDER — BUPIVACAINE-EPINEPHRINE (PF) 0.5% -1:200000 IJ SOLN
INTRAMUSCULAR | Status: DC | PRN
  Administered 2019-07-28: 30 mL

## 2019-07-28 MED ORDER — LIDOCAINE 2% (20 MG/ML) 5 ML SYRINGE
INTRAMUSCULAR | Status: AC
  Filled 2019-07-28: qty 5

## 2019-07-28 MED ORDER — MIDAZOLAM HCL 5 MG/5ML IJ SOLN
INTRAMUSCULAR | Status: DC | PRN
  Administered 2019-07-28: 2 mg via INTRAVENOUS

## 2019-07-28 MED ORDER — CEFAZOLIN SODIUM-DEXTROSE 2-4 GM/100ML-% IV SOLN
2.0000 g | Freq: Four times a day (QID) | INTRAVENOUS | Status: AC
  Administered 2019-07-28 – 2019-07-29 (×2): 2 g via INTRAVENOUS
  Filled 2019-07-28 (×2): qty 100

## 2019-07-28 MED ORDER — ACETAMINOPHEN 10 MG/ML IV SOLN
INTRAVENOUS | Status: AC
  Filled 2019-07-28: qty 100

## 2019-07-28 MED ORDER — LIDOCAINE 2% (20 MG/ML) 5 ML SYRINGE
INTRAMUSCULAR | Status: DC | PRN
  Administered 2019-07-28: 80 mg via INTRAVENOUS

## 2019-07-28 MED ORDER — OXYCODONE HCL 5 MG/5ML PO SOLN: 5.0000 mg | Freq: Once | ORAL | Status: DC | PRN

## 2019-07-28 MED ORDER — SODIUM CHLORIDE 0.9 % IR SOLN
Status: DC | PRN
  Administered 2019-07-28: 3000 mL

## 2019-07-28 MED ORDER — KETAMINE HCL 10 MG/ML IJ SOLN
INTRAMUSCULAR | Status: DC | PRN
  Administered 2019-07-28: 20 mg via INTRAVENOUS
  Administered 2019-07-28 (×3): 10 mg via INTRAVENOUS

## 2019-07-28 MED ORDER — PHENOL 1.4 % MT LIQD: 1.0000 | OROMUCOSAL | Status: DC | PRN

## 2019-07-28 MED ORDER — DOCUSATE SODIUM 100 MG PO CAPS
100.0000 mg | ORAL_CAPSULE | Freq: Two times a day (BID) | ORAL | Status: DC
  Administered 2019-07-29 – 2019-07-30 (×3): 100 mg via ORAL
  Filled 2019-07-28 (×4): qty 1

## 2019-07-28 MED ORDER — KETOROLAC TROMETHAMINE 30 MG/ML IJ SOLN
INTRAMUSCULAR | Status: DC | PRN
  Administered 2019-07-28: 30 mg via INTRAVENOUS

## 2019-07-28 MED ORDER — NON FORMULARY
Status: DC | PRN
  Administered 2019-07-28: 13:00:00 450 mL

## 2019-07-28 MED ORDER — METOCLOPRAMIDE HCL 5 MG/ML IJ SOLN: 5.0000 mg | Freq: Three times a day (TID) | INTRAMUSCULAR | Status: DC | PRN

## 2019-07-28 MED ORDER — PROPOFOL 10 MG/ML IV BOLUS
INTRAVENOUS | Status: DC | PRN
  Administered 2019-07-28: 150 mg via INTRAVENOUS

## 2019-07-28 MED ORDER — BUPIVACAINE-EPINEPHRINE (PF) 0.5% -1:200000 IJ SOLN
INTRAMUSCULAR | Status: AC
  Filled 2019-07-28: qty 30

## 2019-07-28 MED ORDER — LACTATED RINGERS IV SOLN
INTRAVENOUS | Status: DC | PRN
  Administered 2019-07-28 (×3): via INTRAVENOUS

## 2019-07-28 MED ORDER — METOCLOPRAMIDE HCL 5 MG PO TABS: 5.0000 mg | ORAL_TABLET | Freq: Three times a day (TID) | ORAL | Status: DC | PRN

## 2019-07-28 MED ORDER — DEXAMETHASONE SODIUM PHOSPHATE 10 MG/ML IJ SOLN
INTRAMUSCULAR | Status: AC
  Filled 2019-07-28: qty 1

## 2019-07-28 MED ORDER — KETOROLAC TROMETHAMINE 30 MG/ML IJ SOLN
INTRAMUSCULAR | Status: AC
  Filled 2019-07-28: qty 1

## 2019-07-28 SURGICAL SUPPLY — 136 items
ANCHOR DX SWIVELOCK SL 3.5X8.5 (Anchor) ×2 IMPLANT
ANCHOR REPAIR HAND WRIST (Orthopedic Implant) ×2 IMPLANT
BLADE CLIPPER SURG (BLADE) ×4 IMPLANT
BNDG COHESIVE 4X5 TAN STRL (GAUZE/BANDAGES/DRESSINGS) ×4 IMPLANT
BNDG ELASTIC 3X5.8 VLCR STR LF (GAUZE/BANDAGES/DRESSINGS) ×6 IMPLANT
BNDG ELASTIC 4X5.8 VLCR STR LF (GAUZE/BANDAGES/DRESSINGS) ×10 IMPLANT
BNDG ESMARK 4X9 LF (GAUZE/BANDAGES/DRESSINGS) ×6 IMPLANT
BNDG GAUZE ELAST 4 BULKY (GAUZE/BANDAGES/DRESSINGS) ×18 IMPLANT
CANISTER SUCT 3000ML PPV (MISCELLANEOUS) ×6 IMPLANT
CAST PADDING SYN 3 (CAST SUPPLIES) ×2 IMPLANT
CHLORAPREP W/TINT 26 (MISCELLANEOUS) ×6 IMPLANT
CORD BIPOLAR FORCEPS 12FT (ELECTRODE) ×8 IMPLANT
COVER SURGICAL LIGHT HANDLE (MISCELLANEOUS) ×14 IMPLANT
COVER WAND RF STERILE (DRAPES) ×12 IMPLANT
CUFF TOURN SGL QUICK 18X4 (TOURNIQUET CUFF) ×6 IMPLANT
CUFF TOURN SGL QUICK 24 (TOURNIQUET CUFF)
CUFF TRNQT CYL 24X4X16.5-23 (TOURNIQUET CUFF) IMPLANT
CUP ACET PNNCL SECTR W/GRIP 56 (Hips) IMPLANT
DERMABOND ADHESIVE PROPEN (GAUZE/BANDAGES/DRESSINGS) ×2
DERMABOND ADVANCED (GAUZE/BANDAGES/DRESSINGS) ×2
DERMABOND ADVANCED .7 DNX12 (GAUZE/BANDAGES/DRESSINGS) ×8 IMPLANT
DERMABOND ADVANCED .7 DNX6 (GAUZE/BANDAGES/DRESSINGS) IMPLANT
DRAIN TLS ROUND 10FR (DRAIN) IMPLANT
DRAPE C-ARM 42X72 X-RAY (DRAPES) ×6 IMPLANT
DRAPE C-ARM MINI (DRAPES) ×2 IMPLANT
DRAPE EXTREMITY T 121X128X90 (DISPOSABLE) ×2 IMPLANT
DRAPE INCISE IOBAN 66X45 STRL (DRAPES) ×8 IMPLANT
DRAPE OEC MINIVIEW 54X84 (DRAPES) ×8 IMPLANT
DRAPE STERI IOBAN 125X83 (DRAPES) ×6 IMPLANT
DRAPE SURG 17X23 STRL (DRAPES) ×6 IMPLANT
DRAPE U-SHAPE 47X51 STRL (DRAPES) ×18 IMPLANT
DRSG ADAPTIC 3X8 NADH LF (GAUZE/BANDAGES/DRESSINGS) ×4 IMPLANT
DRSG AQUACEL AG ADV 3.5X 6 (GAUZE/BANDAGES/DRESSINGS) ×2 IMPLANT
DRSG AQUACEL AG ADV 3.5X10 (GAUZE/BANDAGES/DRESSINGS) ×6 IMPLANT
ELECT BLADE 4.0 EZ CLEAN MEGAD (MISCELLANEOUS) ×6
ELECT PENCIL ROCKER SW 15FT (MISCELLANEOUS) ×6 IMPLANT
ELECT REM PT RETURN 9FT ADLT (ELECTROSURGICAL) ×6
ELECTRODE BLDE 4.0 EZ CLN MEGD (MISCELLANEOUS) ×4 IMPLANT
ELECTRODE REM PT RTRN 9FT ADLT (ELECTROSURGICAL) ×4 IMPLANT
GAUZE SPONGE 4X4 12PLY STRL (GAUZE/BANDAGES/DRESSINGS) ×6 IMPLANT
GAUZE SPONGE 4X4 12PLY STRL LF (GAUZE/BANDAGES/DRESSINGS) ×4 IMPLANT
GAUZE SPONGE 4X4 16PLY XRAY LF (GAUZE/BANDAGES/DRESSINGS) ×2 IMPLANT
GAUZE XEROFORM 1X8 LF (GAUZE/BANDAGES/DRESSINGS) ×6 IMPLANT
GAUZE XEROFORM 5X9 LF (GAUZE/BANDAGES/DRESSINGS) ×4 IMPLANT
GLOVE BIO SURGEON STRL SZ7.5 (GLOVE) ×2 IMPLANT
GLOVE BIO SURGEON STRL SZ8.5 (GLOVE) ×12 IMPLANT
GLOVE BIOGEL PI IND STRL 6.5 (GLOVE) IMPLANT
GLOVE BIOGEL PI IND STRL 8.5 (GLOVE) ×4 IMPLANT
GLOVE BIOGEL PI INDICATOR 6.5 (GLOVE) ×6
GLOVE BIOGEL PI INDICATOR 8.5 (GLOVE) ×2
GLOVE SS BIOGEL STRL SZ 8 (GLOVE) ×4 IMPLANT
GLOVE SUPERSENSE BIOGEL SZ 8 (GLOVE) ×2
GLOVE SURG SS PI 7.0 STRL IVOR (GLOVE) ×2 IMPLANT
GOWN STRL REUS W/ TWL LRG LVL3 (GOWN DISPOSABLE) ×16 IMPLANT
GOWN STRL REUS W/ TWL XL LVL3 (GOWN DISPOSABLE) ×12 IMPLANT
GOWN STRL REUS W/TWL 2XL LVL3 (GOWN DISPOSABLE) ×6 IMPLANT
GOWN STRL REUS W/TWL LRG LVL3 (GOWN DISPOSABLE) ×8
GOWN STRL REUS W/TWL XL LVL3 (GOWN DISPOSABLE) ×6
HANDPIECE INTERPULSE COAX TIP (DISPOSABLE) ×2
HEAD CERAMIC DELTA 36 PLUS 1.5 (Hips) ×2 IMPLANT
HOOD PEEL AWAY FACE SHEILD DIS (HOOD) ×12 IMPLANT
IMPL SWIVELOCK 3.5X13.5 (Anchor) IMPLANT
IMPL UCL INTERNAL BRACE (Orthopedic Implant) IMPLANT
IMPLANT SWIVELOCK 3.5X13.5 (Anchor) ×6 IMPLANT
IMPLANT UCL INTERNAL BRACE (Orthopedic Implant) ×6 IMPLANT
JET LAVAGE IRRISEPT WOUND (IRRIGATION / IRRIGATOR) ×6
K-WIRE .045X4 (WIRE) ×8 IMPLANT
KIT BASIN OR (CUSTOM PROCEDURE TRAY) ×12 IMPLANT
KIT TURNOVER KIT B (KITS) ×10 IMPLANT
LAVAGE JET IRRISEPT WOUND (IRRIGATION / IRRIGATOR) IMPLANT
LOOP VESSEL MAXI BLUE (MISCELLANEOUS) IMPLANT
MANIFOLD NEPTUNE II (INSTRUMENTS) ×12 IMPLANT
MARKER SKIN DUAL TIP RULER LAB (MISCELLANEOUS) ×12 IMPLANT
NDL HYPO 25GX1X1/2 BEV (NEEDLE) IMPLANT
NDL SPNL 18GX3.5 QUINCKE PK (NEEDLE) ×4 IMPLANT
NDL SUT 6 .5 CRC .975X.05 MAYO (NEEDLE) IMPLANT
NEEDLE 22X1 1/2 (OR ONLY) (NEEDLE) IMPLANT
NEEDLE HYPO 25GX1X1/2 BEV (NEEDLE) IMPLANT
NEEDLE MAYO TAPER (NEEDLE) ×2
NEEDLE SPNL 18GX3.5 QUINCKE PK (NEEDLE) ×6 IMPLANT
NS IRRIG 1000ML POUR BTL (IV SOLUTION) ×12 IMPLANT
PACK ORTHO EXTREMITY (CUSTOM PROCEDURE TRAY) ×6 IMPLANT
PACK TOTAL JOINT (CUSTOM PROCEDURE TRAY) ×6 IMPLANT
PACK UNIVERSAL I (CUSTOM PROCEDURE TRAY) ×6 IMPLANT
PAD ABD 8X10 STRL (GAUZE/BANDAGES/DRESSINGS) ×2 IMPLANT
PAD ARMBOARD 7.5X6 YLW CONV (MISCELLANEOUS) ×24 IMPLANT
PAD CAST 3X4 CTTN HI CHSV (CAST SUPPLIES) ×4 IMPLANT
PAD CAST 4YDX4 CTTN HI CHSV (CAST SUPPLIES) ×8 IMPLANT
PADDING CAST COTTON 3X4 STRL (CAST SUPPLIES) ×2
PADDING CAST COTTON 4X4 STRL (CAST SUPPLIES) ×4
PINN SECTOR W/GRIP ACE CUP 56 (Hips) ×6 IMPLANT
PINNACLE ALTRX PLUS 4 N 36X56 (Hips) ×2 IMPLANT
SAW OSC TIP CART 19.5X105X1.3 (SAW) ×6 IMPLANT
SCREW 6.5MMX35MM (Screw) ×2 IMPLANT
SEALER BIPOLAR AQUA 6.0 (INSTRUMENTS) ×2 IMPLANT
SET HNDPC FAN SPRY TIP SCT (DISPOSABLE) ×4 IMPLANT
SLEEVE CABLE 1.6MM (Orthopedic Implant) IMPLANT
SLEEVE CABLE 2MM VT (Orthopedic Implant) ×2 IMPLANT
SOL PREP POV-IOD 4OZ 10% (MISCELLANEOUS) ×18 IMPLANT
SPLINT FIBERGLASS 3X12 (CAST SUPPLIES) ×2 IMPLANT
SPLINT FIBERGLASS 3X35 (CAST SUPPLIES) ×2 IMPLANT
SPLINT FIBERGLASS 4X30 (CAST SUPPLIES) ×2 IMPLANT
SPONGE LAP 4X18 RFD (DISPOSABLE) IMPLANT
STEM TRI LOC BPS GRIPTON SZ 5 (Hips) IMPLANT
STOCKINETTE TUBULAR SYNTH 4IN (CAST SUPPLIES) ×2 IMPLANT
SUT ETHIBOND NAB CT1 #1 30IN (SUTURE) ×12 IMPLANT
SUT FIBERWIRE 2-0 18 17.9 3/8 (SUTURE) ×6
SUT FIBERWIRE 4-0 18 DIAM BLUE (SUTURE) ×6
SUT FIBERWIRE 4-0 18 TAPR NDL (SUTURE) ×6
SUT MNCRL AB 3-0 PS2 18 (SUTURE) ×4 IMPLANT
SUT MNCRL AB 4-0 PS2 18 (SUTURE) ×6 IMPLANT
SUT MON AB 2-0 CT1 36 (SUTURE) ×6 IMPLANT
SUT PROLENE 3 0 PS 2 (SUTURE) ×6 IMPLANT
SUT PROLENE 4 0 PS 2 18 (SUTURE) ×2 IMPLANT
SUT VIC AB 1 CT1 27 (SUTURE) ×2
SUT VIC AB 1 CT1 27XBRD ANBCTR (SUTURE) ×4 IMPLANT
SUT VIC AB 1 CT1 36 (SUTURE) ×2 IMPLANT
SUT VIC AB 2-0 CT1 27 (SUTURE) ×2
SUT VIC AB 2-0 CT1 TAPERPNT 27 (SUTURE) ×4 IMPLANT
SUT VIC AB 3-0 FS2 27 (SUTURE) ×6 IMPLANT
SUT VLOC 180 0 24IN GS25 (SUTURE) ×6 IMPLANT
SUTURE FIBERWR 2-0 18 17.9 3/8 (SUTURE) IMPLANT
SUTURE FIBERWR 4-0 18 DIA BLUE (SUTURE) IMPLANT
SUTURE FIBERWR 4-0 18 TAPR NDL (SUTURE) IMPLANT
SYR 50ML LL SCALE MARK (SYRINGE) ×6 IMPLANT
SYR CONTROL 10ML LL (SYRINGE) IMPLANT
SYSTEM CHEST DRAIN TLS 7FR (DRAIN) IMPLANT
TOWEL GREEN STERILE (TOWEL DISPOSABLE) ×18 IMPLANT
TOWEL GREEN STERILE FF (TOWEL DISPOSABLE) ×12 IMPLANT
TRAY CATH 16FR W/PLASTIC CATH (SET/KITS/TRAYS/PACK) ×2 IMPLANT
TRI LOC BPS W GRIPTON SZ 5 (Hips) ×6 IMPLANT
TUBE CONNECTING 12'X1/4 (SUCTIONS) ×2
TUBE CONNECTING 12X1/4 (SUCTIONS) ×6 IMPLANT
TUBE EVACUATION TLS (MISCELLANEOUS) ×4 IMPLANT
UNDERPAD 30X30 (UNDERPADS AND DIAPERS) ×6 IMPLANT
WATER STERILE IRR 1000ML POUR (IV SOLUTION) ×24 IMPLANT

## 2019-07-28 NOTE — Op Note (Signed)
Preoperative diagnosis: #1 complex right elbow fracture dislocation with medial epicondyle fracture, coronoid fracture, ulnar collateral ligament rupture with instability and capsular injury with a subluxed elbow joint after initial reduction #2 lunate volar dislocation status post closure reduction in the emergency room-continued wrist instability with associated distal radius fracture and osteocartilaginous injury  Postop diagnosis the same  Operative procedure: #1 open relocation right elbow dislocation #2 ulnar collateral ligament repair direct primary and with internal bridge/brace utilizing labral tape with Arthrex materials/push lock #3 open treatment distal humerus/medial epicondylar fracture #4 open treatment olecranon fracture/coronoid fracture with primary excision of the coronoid fragment #5 ulnar nerve decompression elbow in situ #6 medial antebrachial cutaneous nerve neuroplasty #7 left wrist carpal tunnel release #8 left wrist EPL tendon transfer #9 posterior interosseous nerve neurectomy left wrist #10 open treatment distal radius fracture left wrist #11 open treatment complex wrist instability secondary to lunate dislocation with direct ligamentous repair, capsulorrhaphy and pinning of the radiocarpal and midcarpal joint.  We utilized a tendon graft and 3.5 Arthrex swivel locks for a scapholunate reconstruction.  #12 radiographic series 6 views right elbow and 6 views left wrist #13 Kirschner wire fixation of the scaphocapitate and lunotriquetral joints with repair of the volar floor of the capsule left wrist  Surgeon [name]  Anesthesia General  Tourniquet time less than 2 hours on the right arm and less than 2 hours on the left arm  Estimated blood loss minimal  Operative procedure in detail: Patient was taken to the operative theater and underwent a total hip arthroplasty by Dr. [name] my partner.  This was a left total hip arthroplasty.  Following the procedure he  was very carefully placed on the standard operating table and had the right upper extremity prepped and draped.  I was very careful to handle this myself at all times.  I performed for separate Hibiclens scrubs as his arm was quite dirty followed by 10-minute surgical Betadine scrub and paint all with the arm held at 90 degrees and reduced.  Following this we called with final timeout and applied sterile draping.  Once this was complete sterile tourniquet was applied and insufflated to 250 mmHg.  This time I performed a utilitarian posterior medial incision.  Skin flaps were elevated.  The medial antebrachial cutaneous nerve was identified and underwent a neuroplasty as it was encased in bloody hematoma and wrapped around some frayed torn tissue.  Following this attention was turned towards the ulnar nerve.  The ulnar nerve was released about the arcade of Struthers medial intermuscular septum 2 heads of the FCU Osborne's ligament and the cubital tunnel.  This was an in situ ulnar nerve release and was a distinct and separate portion of the procedure necessary for restoration of sensation given the significant compression due to the injury.  Following this attention was turned towards the medial elbow.  The medial epicondylar fragments were treated with open excision.  Medial epicondylar fragments as noted preoperatively on x-ray were loose and unstable and I remove these as well as osteocartilaginous fragments within the joint.  I reduced the joint.  I made sure the joint was cleaned out nicely and remove foreign debris and other issues.  Once this was complete we then performed placement of a 3.5 push lock with labral tape and 2-0 FiberWire attached to it.  This was placed just beneath the sublime tubercle I checked this under x-ray and it looked excellent.  Following this I then reduced the joint.  Once  the joint was reduced I found the point of isometry and marked this.  This was done under x-ray.   Once point of isometry was marked I then performed a direct repair with the #2 FiberWire and then used the additional limbs of this as well as a labral tape to feed into a 3.5 push lock for purposes of capturing the ligament to the point of isometry with a labral tape which served as an internal brace.  I then placed the patient to a full range of motion and he was noted to have excellent stability in pronation supination flexion and extension.  I checked the lateral ulnar collateral ligament.  Although I do feel this was injured as well it was quite stable after the medial side repairs.  I checked the coronoid.  It was stable.  There is a small coronoid chip which I removed under direct vision.  This was a open treatment of an ulna fracture with coronoid portion excision.  I then irrigated the joint out and checked the patient 1 more time for stability was looked excellent.  Once the stability was confirmed we irrigated copiously and then performed closure of the fascia and checked the nerves once again.  The nerves looked excellent I then sutured the patient with 3-0 Prolene.  I kept him at 90 degrees at all times and he tolerated this well.  I was quite pleased the stability.  Long-arm splint with stirrups and sterile dressing was applied without difficulty.  He was soft in terms of his compartments had excellent refill and follow-up quite well.  I was quite pleased with this.  Drapes were removed and following this the table was then turned for positioning.   Timeout was called for the left upper extremity and following this 3-4 Hibiclens scrubs followed by a 10-minute surgical Betadine scrub performed by myself was accomplished.  The patient tolerated this well without difficulty.  Once this was complete sterile drapes were applied and the patient had a volar approach made for carpal tunnel release.  1 inch incision was made dissection was carried down palmar fascia retracted and the distal edge of the  transverse carpal ligament was released off of the ulnar ledge.  Superficial radial arch and ulnar artery were identified and protected.  I then released the proximal leaflet without difficulty.  The median nerve is intact contused and hyperemic there was blood in the canal.  Once this was complete we then irrigated copiously and performed closure of the wound with a brief tourniquet deflation.  This was a standard carpal tunnel release secondary to lunate dislocation volarly.  Following this tourniquet was reapplied incision was made utilitarian in nature for a dorsal approach to the wrist.  Dissection was carried down of his large amount of capsular trauma noted.  I released the third fourth and second compartments and performed an EPL transfer to the dorsal subcutaneous tissue.  Following this I performed a posterior interosseous nerve neurectomy with correction and cauterization technique he actually had a fairly robust nerve.  Once this was complete I performed treatment of the distal radius fracture with excision of loose fragments and tacked down the fragments which still had soft tissue investments.  I then cleaned out the joint which was highly unstable.  There was some osteocartilaginous wear in the area.  Synovectomy arthrotomy was carried out.  Thus at this time open treatment of the distal radius as well as PIN neurectomy and EPL transfer was accomplished.  I then  noted that the patient was going to need a capsular and direct repair of the anatomy about the radiocarpal joint given the complete lunotriquetral and scapholunate ligament injuries.  I looked at the area about the lunate and triquetrum and actually from a dorsal approach was able to place a stitch in the space of Poirier.  This was done without difficulty.  This was a capsular repair volarly.  Following this I then pinned the lunate and the scaphoid for purposes of reduction as well as the triquetrum.  I then harvested a 2 to 3 mm strip  of ECRL tendon utilizing a counterincision and got a 9 cm length graft.  FiberWire was placed around the end of the graft.  Following this I placed guidepins for the Arthrex scapholunate interosseous ligament repair in the scaphoid x2 and lunate x1 check this under radiograph and then drilled the holes.  Following this I placed a scapho-capitate pin followed by reduction of the lunotriquetral ligament with LT pinning with 2.045 K wires.  This was checked under the image and all looked excellent.  The lunate was perfectly centered and I was pleased with this.  Following the fixation with pinning I then placed a 3.5 swivel lock with suture tape and the graft in the scaphoid proximal pole followed by lunate and then into the scaphoid distal pole.  This created a triangular internal bridge and ligamentous autograft reconstruction of the capsule and the ligament itself.  This had excellent fit and tension and I was pleased with this.  I then performed a complex capsular repair with FiberWire.  The patient tolerated this quite well.  The tourniquet was deflated the EPL was left transposed and the wound was sutured with Prolene.  Patient tolerated this well he was placed in standard dressing with dorsal and volar thumb spica slabs and in a Carter arm pillow.  He will be monitored in the stepdown unit tonight.  I discussed with trauma surgery and of also discussed with his wife over the telephone.  Going forward we will plan for a slow cautious approach to his rehabilitation through my office.  At 2 weeks we will remove his sutures and in the left wrist we will plan for 6 to 8 weeks of immobilization followed by pin removal followed by progressive mobilization of the wrist no strengthening until he is 3 months out.  For the elbow will begin gentle range of motion in the form of flexion and extension to 90 degrees as well as full pronation supination efforts followed by sequentially coming down 15 to 20 degrees  weekly to restore elbow motion.  It was pleasure seeing today.  We will keep him on Ancef and appropriate pain management.  Hussain Maimone MD

## 2019-07-28 NOTE — Op Note (Signed)
OPERATIVE REPORT  SURGEON: Rod Can, MD   ASSISTANT: Katy Apo, RNFA.  PREOPERATIVE DIAGNOSIS: Displaced Left femoral neck fracture.   POSTOPERATIVE DIAGNOSIS: Displaced Left femoral neck fracture.   PROCEDURE: Left total hip arthroplasty, anterior approach.   IMPLANTS: DePuy Tri Lock stem, size 5, hi offset. DePuy Pinnacle Cup, size 56 mm. DePuy Altrx liner, size 36 by 56 mm, +4 neutral. DePuy Biolox ceramic head ball, size 36 + 1.5 mm. 6.5 mm cancellous bone screw x1. Dall-Miles 2.0 mm adult reconstruction cable.  ANESTHESIA:  General  ANTIBIOTICS: 2g ancef.  ESTIMATED BLOOD LOSS:-350 mL    DRAINS: None.  COMPLICATIONS: None   CONDITION: PACU - hemodynamically stable.   BRIEF CLINICAL NOTE: Robert Hahn is a 58 y.o. male who had a 20 foot fall and sustained multiple injuries.  From an orthopedic standpoint, he had a right elbow fracture dislocation, left lunate dislocation, and a displaced Left femoral neck fracture. The patient was admitted to the trauma surgery service and underwent perioperative risk stratification and medical optimization.  He was indicated for left total hip arthroplasty to treat his femoral neck fracture.  After this procedure, Dr. Amedeo Plenty will be doing bilateral upper extremity surgery.  The risks, benefits, and alternatives to total hip arthroplasty were explained, and the patient elected to proceed.  PROCEDURE IN DETAIL: The patient was taken to the operating room and general anesthesia was induced on the hospital bed.  The patient was then positioned on the Hana table.  All bony prominences were well padded.  The hip was prepped and draped in the normal sterile surgical fashion.  A time-out was called verifying side and site of surgery. Antibiotics were given within 60 minutes of beginning the procedure.  The direct anterior approach to the hip was performed through the Hueter interval.  Lateral femoral circumflex vessels were  treated with the Auqumantys. The anterior capsule was exposed and an inverted T capsulotomy was made.  Fracture hematoma was encountered and evacuated. The patient was found to have a comminuted, vertical Left mid cervical femoral neck fracture.  I freshened the femoral neck cut with a saw.  I removed the femoral neck fragment.  A corkscrew was placed into the head and the head was removed.  This was passed to the back table and was measured.  There is a V shaped defect on the outer cortex of the calcar.  Therefore I elected to place a single subperiosteal 2.0 mm adult reconstruction cable prophylactically, just above the lesser trochanter.   Acetabular exposure was achieved, and the pulvinar and labrum were excised. Sequential reaming of the acetabulum was then performed up to a size 55 mm reamer. A 56 mm cup was then opened and impacted into place at approximately 40 degrees of abduction and 20 degrees of anteversion.  I elected to place a single 6.5 mm cancellous screw to augment our already acceptable press-fit fixation.  The final polyethylene liner was impacted into place.    I then gained femoral exposure taking care to protect the abductors and greater trochanter.  This was performed using standard external rotation, extension, and adduction.  The capsule was peeled off the inner aspect of the greater trochanter, taking care to preserve the short external rotators. A cookie cutter was used to enter the femoral canal, and then the femoral canal finder was used to confirm location.  I then sequentially broached up to a size 5.  Calcar planer was used on the femoral neck remnant.  I paced a  hi neck and a trial head ball. The hip was reduced.  Leg lengths were checked fluoroscopically.  The hip was dislocated and trial components were removed.  I placed the real stem followed by the real spacer and head ball.  The hip was reduced.  Fluoroscopy was used to confirm component position and leg lengths.  At 90  degrees of external rotation and extension, the hip was stable to an anterior directed force.   The wound was copiously irrigated with Irrisept solution and normal saline using pule lavage.  Marcaine solution was injected into the periarticular soft tissue.  The wound was closed in layers using #1 Vicryl and V-Loc for the fascia, 2-0 Vicryl for the subcutaneous fat, 2-0 Monocryl for the deep dermal layer, 3-0 running Monocryl subcuticular stitch and glue for the skin.  Once the glue was fully dried, an Aquacell Ag dressing was applied.  The patient was then turned over to the care of Dr. Amanda PeaGramig.  Please see his dictation for further details.  Sponge, needle, and instrument counts were correct at the end of the case x2.  The patient tolerated the procedure well and there were no known complications.

## 2019-07-28 NOTE — Anesthesia Procedure Notes (Signed)
Procedure Name: Intubation Performed by: Luverta Korte H, CRNA Pre-anesthesia Checklist: Patient identified, Emergency Drugs available, Suction available and Patient being monitored Patient Re-evaluated:Patient Re-evaluated prior to induction Oxygen Delivery Method: Circle System Utilized Preoxygenation: Pre-oxygenation with 100% oxygen Induction Type: IV induction Ventilation: Mask ventilation without difficulty Laryngoscope Size: Mac and 4 Grade View: Grade II Tube type: Oral Tube size: 7.0 mm Number of attempts: 1 Airway Equipment and Method: Stylet and Oral airway Placement Confirmation: ETT inserted through vocal cords under direct vision,  positive ETCO2 and breath sounds checked- equal and bilateral Secured at: 23 cm Tube secured with: Tape Dental Injury: Teeth and Oropharynx as per pre-operative assessment        

## 2019-07-28 NOTE — Progress Notes (Signed)
PT Cancellation Note  Patient Details Name: Robert Hahn MRN: [id number] DOB: 1960/12/15   Cancelled Treatment:    Reason Eval/Treat Not Completed: Patient not medically ready- patient going in for surgery today. Evaluate tomorrow if appropriate.    [name] 07/28/2019, 10:06 AM

## 2019-07-28 NOTE — Transfer of Care (Signed)
Immediate Anesthesia Transfer of Care Note  Patient: Robert Hahn  Procedure(s) Performed: TOTAL HIP ARTHROPLASTY ANTERIOR APPROACH (Left Hip) OPEN REDUCTION INTERNAL FIXATION (ORIF) ELBOW/OLECRANON FRACTURE AND LIGAMENT RECONSTRUCTION (Right Elbow) OPEN REDUCTION INTERNAL FIXATION (ORIF) WRIST FRACTURE (Left Wrist) Carpal Tunnel Release (Left Hand)  Patient Location: PACU  Anesthesia Type:General  Level of Consciousness: awake  Airway & Oxygen Therapy: Patient Spontanous Breathing and Patient connected to face mask oxygen  Post-op Assessment: Report given to RN and Post -op Vital signs reviewed and stable  Post vital signs: Reviewed and stable  Last Vitals:  Vitals Value Taken Time  BP 162/75 07/28/19 1826  Temp    Pulse 101 07/28/19 1830  Resp 19 07/28/19 1830  SpO2 93 % 07/28/19 1830  Vitals shown include unvalidated device data.  Last Pain:  Vitals:   07/28/19 0730  TempSrc: Oral  PainSc:          Complications: No apparent anesthesia complications

## 2019-07-28 NOTE — Progress Notes (Signed)
Patient ID: Robert Hahn, male   DOB: 1961-05-01, 58 y.o.   MRN: [id number]    Day of Surgery  Subjective: Anxious.  Pain controlled.  Ready for surgery.  Ate well last night.  Objective: Vital signs in last 24 hours: Temp:  [98.2 F (36.8 C)-99.4 F (37.4 C)] 98.2 F (36.8 C) (09/08 0730) Pulse Rate:  [89-102] 91 (09/08 0730) Resp:  [16-18] 18 (09/08 0730) BP: (143-199)/(63-92) 143/84 (09/08 0730) SpO2:  [95 %-97 %] 96 % (09/08 0730)    Intake/Output from previous day: 09/07 0701 - 09/08 0700 In: 480 [P.O.:480] Out: 600 [Urine:600] Intake/Output this shift: No intake/output data recorded.  PE: Gen: NAD, some fidgetiness with RLE HEENT: few small scrapes on face Heart: regular Lungs: CTAB Abd: soft, NT, Nd, +BS Ext: RUE, RLE in splints.  LLE in traction.  Moves all toes and fingers.  Some numbness to fingers on both hands   Lab Results:  Recent Labs    07/27/19 0521 07/28/19 0320  WBC 13.9* 10.1  HGB 16.1 14.8  HCT 47.3 42.1  PLT 167 154   BMET Recent Labs    07/27/19 0521 07/28/19 0320  NA 141 137  K 4.2 3.3*  CL 109 106  CO2 20* 21*  GLUCOSE 138* 145*  BUN 15 10  CREATININE 1.02 1.05  CALCIUM 8.8* 8.8*   PT/INR Recent Labs    07/26/19 2001  LABPROT 13.7  INR 1.1   CMP     Component Value Date/Time   NA 137 07/28/2019 0320   K 3.3 (L) 07/28/2019 0320   CL 106 07/28/2019 0320   CO2 21 (L) 07/28/2019 0320   GLUCOSE 145 (H) 07/28/2019 0320   BUN 10 07/28/2019 0320   CREATININE 1.05 07/28/2019 0320   CALCIUM 8.8 (L) 07/28/2019 0320   PROT 6.1 (L) 07/26/2019 2001   ALBUMIN 3.9 07/26/2019 2001   AST 106 (H) 07/26/2019 2001   ALT 111 (H) 07/26/2019 2001   ALKPHOS 56 07/26/2019 2001   BILITOT 0.7 07/26/2019 2001   GFRNONAA >60 07/28/2019 0320   GFRAA >60 07/28/2019 0320   Lipase  No results found for: LIPASE     Studies/Results: Dg Shoulder Right  Result Date: 07/26/2019 CLINICAL DATA:  Right shoulder pain EXAM: RIGHT SHOULDER  - 2+ VIEW COMPARISON:  None. FINDINGS: There is no evidence of fracture or dislocation. Visualized portion of the lung is clear. Soft tissues are unremarkable. IMPRESSION: No acute osseous abnormality. Electronically Signed   By: Prudencio Pair M.D.   On: 07/26/2019 21:56   Dg Elbow 2 Views Right  Result Date: 07/27/2019 CLINICAL DATA:  Postreduction EXAM: RIGHT ELBOW - 2 VIEW COMPARISON:  None. FINDINGS: There is been interval reduction posterior dislocation the in near anatomic. Ossific fragments are seen adjacent to the medial. Surrounding soft tissue swelling. IMPRESSION: Interval reduction the elbow dislocation in near anatomic alignment. Medial epicondyle fracture. Electronically Signed   By: Prudencio Pair M.D.   On: 07/27/2019 01:51   Dg Elbow Complete Right  Result Date: 07/26/2019 CLINICAL DATA:  Pain after fall EXAM: RIGHT ELBOW - COMPLETE 3+ VIEW COMPARISON:  None. FINDINGS: There is a 1.1 cm osseous fragment seen adjacent to the medial epicondyle, likely fracture from the medial epicondyle. There is complete posterior dislocation of the radius and ulna at the elbow. There is also a tiny osseous fragment seen adjacent to the capitellar surface of indeterminate origin. Significant surrounding soft tissue swelling and elbow joint effusion is seen. IMPRESSION: Osseous fragment  adjacent to the medial epicondyle, likely medial epicondylar fracture. Complete posterior dislocation at the elbow. The osseous fragment seen adjacent to the capitellum of indeterminate origin. Significant surrounding soft tissue swelling and elbow joint effusion. Electronically Signed   By: Jonna Clark M.D.   On: 07/26/2019 21:59   Dg Wrist 2 Views Left  Result Date: 07/27/2019 CLINICAL DATA:  Postreduction EXAM: LEFT WRIST - 2 VIEW COMPARISON:  None. FINDINGS: There is been interval reduction of the lunate dislocation and near anatomic. There is comminuted radial styloid fracture. Soft tissue swelling. IMPRESSION: Interval  reduction lunate dislocation in near anatomic alignment. Radial styloid fracture. Electronically Signed   By: Jonna Clark M.D.   On: 07/27/2019 01:52   Dg Wrist 2 Views Right  Result Date: 07/27/2019 CLINICAL DATA:  Pain after fall EXAM: RIGHT WRIST - 2 VIEW COMPARISON:  None. FINDINGS: Overlying material obscures fine bony detail. Definite acute fracture seen. Radioulnar joint appears to be intact. Overlying soft tissue. IMPRESSION: No acute osseous abnormality. Electronically Signed   By: Jonna Clark M.D.   On: 07/27/2019 01:54   Dg Wrist Complete Left  Result Date: 07/26/2019 CLINICAL DATA:  Fall EXAM: LEFT WRIST - COMPLETE 3+ VIEW COMPARISON:  None. FINDINGS: There is a complete anterior volar dislocation of the lunate. There is superior migration of the radius with volar angulation. There is comminuted fracture seen of the distal radius and ulnar styloid with small fracture fragments seen along the radial surface. There is ulnar subluxation of the distal ulna. Significant surrounding soft tissue swelling is seen. There is a mild metallic foreign body seen overlying the fifth metacarpal head. IMPRESSION: Complete anterior volar dislocation of the lunate. Comminuted fracture of the distal radius with superior impaction of the distal radius. Electronically Signed   By: Jonna Clark M.D.   On: 07/26/2019 21:55   Ct Head Wo Contrast  Result Date: 07/26/2019 CLINICAL DATA:  Fall from tree. EXAM: CT HEAD WITHOUT CONTRAST CT CERVICAL SPINE WITHOUT CONTRAST TECHNIQUE: Multidetector CT imaging of the head and cervical spine was performed following the standard protocol without intravenous contrast. Multiplanar CT image reconstructions of the cervical spine were also generated. COMPARISON:  None. FINDINGS: CT HEAD FINDINGS Brain: No acute intracranial abnormality. Specifically, no hemorrhage, hydrocephalus, mass lesion, acute infarction, or significant intracranial injury. Vascular: No hyperdense vessel or  unexpected calcification. Skull: No acute calvarial abnormality. Sinuses/Orbits: Visualized paranasal sinuses and mastoids clear. Orbital soft tissues unremarkable. Other: None CT CERVICAL SPINE FINDINGS Alignment: Normal Skull base and vertebrae: No acute fracture. No primary bone lesion or focal pathologic process. Soft tissues and spinal canal: No prevertebral fluid or swelling. No visible canal hematoma. Disc levels: Degenerative disc disease at C4-5 with disc space narrowing and spurring. Upper chest: Posterior right 1st rib fracture noted. There appears to be a right effusion partially imaged. See chest CT report for full discussion. Other: None IMPRESSION: No acute intracranial abnormality. Fracture through the posterior right first rib. Probable right pleural effusion. No acute bony abnormality in the cervical spine. Electronically Signed   By: Charlett Nose M.D.   On: 07/26/2019 21:05   Ct Chest W Contrast  Addendum Date: 07/26/2019   ADDENDUM REPORT: 07/26/2019 21:27 ADDENDUM: A minimally displaced proximal right first rib fracture is present. No additional rib fractures are present. Electronically Signed   By: Marin Roberts M.D.   On: 07/26/2019 21:27   Result Date: 07/26/2019 CLINICAL DATA:  Abd trauma, blunt, stable. Hit by tree branch. Fell from tree.  EXAM: CT CHEST, ABDOMEN, AND PELVIS WITH CONTRAST TECHNIQUE: Multidetector CT imaging of the chest, abdomen and pelvis was performed following the standard protocol during bolus administration of intravenous contrast. CONTRAST:  100mL OMNIPAQUE IOHEXOL 300 MG/ML  SOLN COMPARISON:  None. FINDINGS: CT CHEST FINDINGS Cardiovascular: Heart size is normal. In aorta and great vessel origins are within normal limits. Pulmonary arteries are normal. There is no significant pericardial effusion or trauma. Mediastinum/Nodes: Significant mediastinal, hilar, or axillary adenopathy is present. There is no significant mediastinal effusion or air. Lungs/Pleura:  Minimal dependent atelectasis is present. Lungs are otherwise clear without focal nodule, mass, or airspace disease. Is no pulmonary contusion. No pneumothorax is present. No significant pleural effusions are present. Musculoskeletal: 12 rib-bearing thoracic type vertebral bodies are present. No acute or healing fractures are present. Ribs are unremarkable. CT ABDOMEN PELVIS FINDINGS Hepatobiliary: Hemorrhage is present along the posterior margin of the liver. No focal hepatic contusion is present. The common bile duct and gallbladder normal. Pancreas: Unremarkable. No pancreatic ductal dilatation or surrounding inflammatory changes. Spleen: No splenic injury or perisplenic hematoma. Adrenals/Urinary Tract: There is diffuse hemorrhage about the right adrenal gland. Focal collection is present. Left adrenal gland is normal. Simple cyst in the left kidney measures 5.3 x 4.1 x 4.3 cm. Kidneys and ureters are otherwise within normal limits bilaterally. The urinary bladder is within normal limits. Stomach/Bowel: Stomach and duodenum are within normal limits. Small bowel is unremarkable. Terminal ileum is normal. The appendix is visualized and normal. The ascending and transverse colon are normal. Descending and sigmoid colon are within normal limits. Vascular/Lymphatic: Scattered atherosclerotic calcifications are present. There is no aneurysm. No significant retroperitoneal adenopathy is present. Reproductive: Prostate is unremarkable. Other: No abdominal wall hernia or abnormality. No abdominopelvic ascites. Musculoskeletal: Left transverse process fractures are present at L1, L2, and L3. Fracture fragments are displaced posteriorly 1 shaft width. Vertebral body heights are maintained. No additional fractures present in the spine. Sacrum is intact. A comminuted left femoral neck fracture is present. Varus angulation is noted. Right hip is located. Left acetabulum is intact. IMPRESSION: 1. Right adrenal hemorrhage. 2.  Hemorrhage extending into Morrison's pouch and about the free edge of the liver posteriorly. No definite hemorrhagic contusion is present. 3. Left L1, L2, and L3 transverse process fractures 4. Comminuted left femoral neck fracture. Electronically Signed: By: Marin Robertshristopher  Mattern M.D. On: 07/26/2019 21:18   Ct Cervical Spine Wo Contrast  Result Date: 07/26/2019 CLINICAL DATA:  Fall from tree. EXAM: CT HEAD WITHOUT CONTRAST CT CERVICAL SPINE WITHOUT CONTRAST TECHNIQUE: Multidetector CT imaging of the head and cervical spine was performed following the standard protocol without intravenous contrast. Multiplanar CT image reconstructions of the cervical spine were also generated. COMPARISON:  None. FINDINGS: CT HEAD FINDINGS Brain: No acute intracranial abnormality. Specifically, no hemorrhage, hydrocephalus, mass lesion, acute infarction, or significant intracranial injury. Vascular: No hyperdense vessel or unexpected calcification. Skull: No acute calvarial abnormality. Sinuses/Orbits: Visualized paranasal sinuses and mastoids clear. Orbital soft tissues unremarkable. Other: None CT CERVICAL SPINE FINDINGS Alignment: Normal Skull base and vertebrae: No acute fracture. No primary bone lesion or focal pathologic process. Soft tissues and spinal canal: No prevertebral fluid or swelling. No visible canal hematoma. Disc levels: Degenerative disc disease at C4-5 with disc space narrowing and spurring. Upper chest: Posterior right 1st rib fracture noted. There appears to be a right effusion partially imaged. See chest CT report for full discussion. Other: None IMPRESSION: No acute intracranial abnormality. Fracture through the posterior right first  rib. Probable right pleural effusion. No acute bony abnormality in the cervical spine. Electronically Signed   By: Charlett Nose M.D.   On: 07/26/2019 21:05   Ct Abdomen Pelvis W Contrast  Addendum Date: 07/26/2019   ADDENDUM REPORT: 07/26/2019 21:27 ADDENDUM: A minimally  displaced proximal right first rib fracture is present. No additional rib fractures are present. Electronically Signed   By: Marin Roberts M.D.   On: 07/26/2019 21:27   Result Date: 07/26/2019 CLINICAL DATA:  Abd trauma, blunt, stable. Hit by tree branch. Fell from tree. EXAM: CT CHEST, ABDOMEN, AND PELVIS WITH CONTRAST TECHNIQUE: Multidetector CT imaging of the chest, abdomen and pelvis was performed following the standard protocol during bolus administration of intravenous contrast. CONTRAST:  OMNIPAQUE IOHEXOL 300 MG/ML  SOLN COMPARISON:  None. FINDINGS: CT CHEST FINDINGS Cardiovascular: Heart size is normal. In aorta and great vessel origins are within normal limits. Pulmonary arteries are normal. There is no significant pericardial effusion or trauma. Mediastinum/Nodes: Significant mediastinal, hilar, or axillary adenopathy is present. There is no significant mediastinal effusion or air. Lungs/Pleura: Minimal dependent atelectasis is present. Lungs are otherwise clear without focal nodule, mass, or airspace disease. Is no pulmonary contusion. No pneumothorax is present. No significant pleural effusions are present. Musculoskeletal: 12 rib-bearing thoracic type vertebral bodies are present. No acute or healing fractures are present. Ribs are unremarkable. CT ABDOMEN PELVIS FINDINGS Hepatobiliary: Hemorrhage is present along the posterior margin of the liver. No focal hepatic contusion is present. The common bile duct and gallbladder normal. Pancreas: Unremarkable. No pancreatic ductal dilatation or surrounding inflammatory changes. Spleen: No splenic injury or perisplenic hematoma. Adrenals/Urinary Tract: There is diffuse hemorrhage about the right adrenal gland. Focal collection is present. Left adrenal gland is normal. Simple cyst in the left kidney measures 5.3 x 4.1 x 4.3 cm. Kidneys and ureters are otherwise within normal limits bilaterally. The urinary bladder is within normal limits.  Stomach/Bowel: Stomach and duodenum are within normal limits. Small bowel is unremarkable. Terminal ileum is normal. The appendix is visualized and normal. The ascending and transverse colon are normal. Descending and sigmoid colon are within normal limits. Vascular/Lymphatic: Scattered atherosclerotic calcifications are present. There is no aneurysm. No significant retroperitoneal adenopathy is present. Reproductive: Prostate is unremarkable. Other: No abdominal wall hernia or abnormality. No abdominopelvic ascites. Musculoskeletal: Left transverse process fractures are present at L1, L2, and L3. Fracture fragments are displaced posteriorly 1 shaft width. Vertebral body heights are maintained. No additional fractures present in the spine. Sacrum is intact. A comminuted left femoral neck fracture is present. Varus angulation is noted. Right hip is located. Left acetabulum is intact. IMPRESSION: 1. Right adrenal hemorrhage. 2. Hemorrhage extending into Morrison's pouch and about the free edge of the liver posteriorly. No definite hemorrhagic contusion is present. 3. Left L1, L2, and L3 transverse process fractures 4. Comminuted left femoral neck fracture. Electronically Signed: By: Marin Roberts M.D. On: 07/26/2019 21:18   Ct Wrist Left Wo Contrast  Result Date: 07/27/2019 CLINICAL DATA:  Evaluate left wrist EXAM: CT OF THE LEFT WRIST WITHOUT CONTRAST TECHNIQUE: Multidetector CT imaging was performed according to the standard protocol. Multiplanar CT image reconstructions were also generated. COMPARISON:  Radiograph same day FINDINGS: Bones/Joint/Cartilage There is been interval reduction the lunate dislocation. There is a tiny osseous fragments seen surrounding volar hand dorsal surface the lunate. There is comminuted intra-articular fracture seen radial styloid with small ossific fragments at the radius scaphoid joint space. The radioulnar joint appears fractures. There is  diffuse soft tissue Ligaments  Suboptimally assessed by CT. Muscles and Tendons The muscles and tendons to be grossly. Soft tissues Diffuse soft tissue swelling seen surrounding the wrist. IMPRESSION: 1. Comminuted intra-articular radial styloid fracture. 2. Interval reduction the tiny ossific fragments. Electronically Signed   By: Jonna Clark M.D.   On: 07/27/2019 01:50   Ct Elbow Right Wo Contrast  Result Date: 07/27/2019 CLINICAL DATA:  Postreduction EXAM: CT OF THE LOWER RIGHT EXTREMITY WITHOUT CONTRAST TECHNIQUE: Multidetector CT imaging of the right lower extremity was performed according to the standard protocol. COMPARISON:  None. FINDINGS: Bones/Joint/Cartilage There is been interval reduction of the posterior elbow dislocation. Slight posterior tilt of the ulna at the trochlear surface. Intra-articular comminuted fracture medial epicondyle. Tiny nondisplaced fracture of the anterior coronoid. There is a moderate elbow effusion seen the radial does articulate the capitellum Ligaments Suboptimally assessed by CT. Muscles and Tendons The muscles appear to intact.  Visualization tendons Soft tissues Significant surrounding soft tissue posteriorly. IMPRESSION: Interval reduction of posterior elbow dislocation. There remains slight posterior tilt of the ulna at the trochlear surface. Comminuted fracture of the medial epicondyle and mildly tiny coronoid process fracture. Elbow joint effusion Electronically Signed   By: Jonna Clark M.D.   On: 07/27/2019 02:02   Dg Chest Port 1 View  Result Date: 07/28/2019 CLINICAL DATA:  Pleural effusion status post fall EXAM: PORTABLE CHEST 1 VIEW COMPARISON:  CT chest 07/26/2019 FINDINGS: There is mild left basilar atelectasis. There is no focal consolidation. There is no pleural effusion or pneumothorax. The heart and mediastinal contours are unremarkable. The nondisplaced fracture at the tip of left twelfth rib is not well visualized on the current exam. There is otherwise no acute osseous  abnormality. IMPRESSION: Mild left basilar atelectasis. Electronically Signed   By: Elige Ko   On: 07/28/2019 05:58   Dg Chest Port 1 View  Result Date: 07/26/2019 CLINICAL DATA:  Fall out of tree. EXAM: PORTABLE CHEST 1 VIEW COMPARISON:  None. FINDINGS: The heart size and mediastinal contours are within normal limits. Both lungs are clear. The visualized skeletal structures are unremarkable. IMPRESSION: No active disease. Electronically Signed   By: Charlett Nose M.D.   On: 07/26/2019 20:42   Dg Knee Complete 4 Views Left  Result Date: 07/26/2019 CLINICAL DATA:  Knee pain after fall EXAM: LEFT KNEE - COMPLETE 4+ VIEW COMPARISON:  None. FINDINGS: No fracture or dislocation. Small knee joint effusion is seen. Soft tissue swelling seen around the medial aspect of the knee. The linear calcifications seen in the medial compartment of the knee. IMPRESSION: No acute osseous abnormality. Soft tissue swelling seen around the medial knee with a small knee joint effusion. 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 Electronically Signed   By: Jonna Clark M.D.   On: 07/26/2019 21:57   Dg Hip Port Unilat With Pelvis 1v Left  Result Date: 07/26/2019 CLINICAL DATA:  Left hip pain following a fall out of a tree. EXAM: DG HIP (WITH OR WITHOUT PELVIS) 1V PORT LEFT COMPARISON:  None. FINDINGS: Left femoral neck fracture with mild proximal displacement of the distal fragment and mild varus angulation. No other fractures seen. IMPRESSION: Left femoral neck fracture, as described above. Electronically Signed   By: Beckie Salts M.D.   On: 07/26/2019 20:41    Anti-infectives: Anti-infectives (From admission, onward)   Start     Dose/Rate Route Frequency Ordered Stop   07/28/19 0600  ceFAZolin (ANCEF) IVPB 2g/100  mL premix     2 g 200 mL/hr over 30 Minutes Intravenous On call to O.R. 07/27/19 1926 07/29/19 0559       Assessment/Plan fall, 20 feet Right elbow fracture/dislocation - OR  today with Dr. Amanda PeaGramig Left wrist fx - OR today with Dr. Amanda PeaGramig Left femoral neck fx fracture - in traction, to OR today with Dr. Linna CapriceSwinteck for hip replacement Lumbar transverse process fractures- Dr. Lovell SheehanJenkins- no treatment necessary Adrenal hemorrhage- no treatment indicated, follow hgb (stable). Right posterior 1st rib fx- pain control.  CXR stable FEN - IVFs/NPO for OR, replace K VTE - will plan to start following OR ID - none currently, likely pre-op dose before surgery today   LOS: 2 days    Letha CapeKelly E Allah Reason , [facility] 07/28/2019, 9:38 AM Pager: [phone]

## 2019-07-28 NOTE — Consult Note (Signed)
ORTHOPAEDIC CONSULTATION  REQUESTING PHYSICIAN: Md, Trauma, MD  PCP:  Ignacia PalmaBeck, Mark C., MD  Chief Complaint: Left femoral neck fracture  HPI: Robert NickelJoshua Hahn is a 58 y.o. male who fell 20 feet and sustained multiple orthopedic injuries.  He was found to have right elbow fracture dislocation, left lunate dislocation, and displaced vertical left femoral neck fracture.  He was initially seen by Dr. Jena GaussHaddix.  He underwent emergent closed reduction of the right elbow and left lunate dislocation.  I was asked to see the patient for definitive treatment of the left hip.  Dr. Amanda PeaGramig will be addressing his upper extremity injuries.  He received Lovenox at 10 AM yesterday.  History reviewed. No pertinent past medical history. History reviewed. No pertinent surgical history. Social History   Socioeconomic History   Marital status: Married    Spouse name: Not on file   Number of children: Not on file   Years of education: Not on file   Highest education level: Not on file  Occupational History   Not on file  Social Needs   Financial resource strain: Not on file   Food insecurity    Worry: Not on file    Inability: Not on file   Transportation needs    Medical: Not on file    Non-medical: Not on file  Tobacco Use   Smoking status: Never Smoker   Smokeless tobacco: Never Used  Substance and Sexual Activity   Alcohol use: Yes    Alcohol/week: 4.0 standard drinks    Types: 4 Cans of beer per week   Drug use: Never   Sexual activity: Not on file  Lifestyle   Physical activity    Days per week: Not on file    Minutes per session: Not on file   Stress: Not on file  Relationships   Social connections    Talks on phone: Not on file    Gets together: Not on file    Attends religious service: Not on file    Active member of club or organization: Not on file    Attends meetings of clubs or organizations: Not on file    Relationship status: Not on file  Other Topics  Concern   Not on file  Social History Narrative   Not on file   History reviewed. No pertinent family history. No Known Allergies Prior to Admission medications   Medication Sig Start Date End Date Taking? Authorizing Provider  loratadine (CLARITIN) 10 MG tablet Take 5 mg by mouth daily.   Yes [provider]   Dg Shoulder Right  Result Date: 07/26/2019 CLINICAL DATA:  Right shoulder pain EXAM: RIGHT SHOULDER - 2+ VIEW COMPARISON:  None. FINDINGS: There is no evidence of fracture or dislocation. Visualized portion of the lung is clear. Soft tissues are unremarkable. IMPRESSION: No acute osseous abnormality. Electronically Signed   By: Jonna ClarkBindu  Avutu M.D.   On: 07/26/2019 21:56   Dg Elbow 2 Views Right  Result Date: 07/27/2019 CLINICAL DATA:  Postreduction EXAM: RIGHT ELBOW - 2 VIEW COMPARISON:  None. FINDINGS: There is been interval reduction posterior dislocation the in near anatomic. Ossific fragments are seen adjacent to the medial. Surrounding soft tissue swelling. IMPRESSION: Interval reduction the elbow dislocation in near anatomic alignment. Medial epicondyle fracture. Electronically Signed   By: Jonna ClarkBindu  Avutu M.D.   On: 07/27/2019 01:51   Dg Elbow Complete Right  Result Date: 07/26/2019 CLINICAL DATA:  Pain after fall EXAM: RIGHT ELBOW - COMPLETE 3+  VIEW COMPARISON:  None. FINDINGS: There is a 1.1 cm osseous fragment seen adjacent to the medial epicondyle, likely fracture from the medial epicondyle. There is complete posterior dislocation of the radius and ulna at the elbow. There is also a tiny osseous fragment seen adjacent to the capitellar surface of indeterminate origin. Significant surrounding soft tissue swelling and elbow joint effusion is seen. IMPRESSION: Osseous fragment adjacent to the medial epicondyle, likely medial epicondylar fracture. Complete posterior dislocation at the elbow. The osseous fragment seen adjacent to the capitellum of indeterminate origin.  Significant surrounding soft tissue swelling and elbow joint effusion. Electronically Signed   By: Jonna Clark M.D.   On: 07/26/2019 21:59   Dg Wrist 2 Views Left  Result Date: 07/27/2019 CLINICAL DATA:  Postreduction EXAM: LEFT WRIST - 2 VIEW COMPARISON:  None. FINDINGS: There is been interval reduction of the lunate dislocation and near anatomic. There is comminuted radial styloid fracture. Soft tissue swelling. IMPRESSION: Interval reduction lunate dislocation in near anatomic alignment. Radial styloid fracture. Electronically Signed   By: Jonna Clark M.D.   On: 07/27/2019 01:52   Dg Wrist 2 Views Right  Result Date: 07/27/2019 CLINICAL DATA:  Pain after fall EXAM: RIGHT WRIST - 2 VIEW COMPARISON:  None. FINDINGS: Overlying material obscures fine bony detail. Definite acute fracture seen. Radioulnar joint appears to be intact. Overlying soft tissue. IMPRESSION: No acute osseous abnormality. Electronically Signed   By: Jonna Clark M.D.   On: 07/27/2019 01:54   Dg Wrist Complete Left  Result Date: 07/26/2019 CLINICAL DATA:  Fall EXAM: LEFT WRIST - COMPLETE 3+ VIEW COMPARISON:  None. FINDINGS: There is a complete anterior volar dislocation of the lunate. There is superior migration of the radius with volar angulation. There is comminuted fracture seen of the distal radius and ulnar styloid with small fracture fragments seen along the radial surface. There is ulnar subluxation of the distal ulna. Significant surrounding soft tissue swelling is seen. There is a mild metallic foreign body seen overlying the fifth metacarpal head. IMPRESSION: Complete anterior volar dislocation of the lunate. Comminuted fracture of the distal radius with superior impaction of the distal radius. Electronically Signed   By: Jonna Clark M.D.   On: 07/26/2019 21:55   Ct Head Wo Contrast  Result Date: 07/26/2019 CLINICAL DATA:  Fall from tree. EXAM: CT HEAD WITHOUT CONTRAST CT CERVICAL SPINE WITHOUT CONTRAST TECHNIQUE:  Multidetector CT imaging of the head and cervical spine was performed following the standard protocol without intravenous contrast. Multiplanar CT image reconstructions of the cervical spine were also generated. COMPARISON:  None. FINDINGS: CT HEAD FINDINGS Brain: No acute intracranial abnormality. Specifically, no hemorrhage, hydrocephalus, mass lesion, acute infarction, or significant intracranial injury. Vascular: No hyperdense vessel or unexpected calcification. Skull: No acute calvarial abnormality. Sinuses/Orbits: Visualized paranasal sinuses and mastoids clear. Orbital soft tissues unremarkable. Other: None CT CERVICAL SPINE FINDINGS Alignment: Normal Skull base and vertebrae: No acute fracture. No primary bone lesion or focal pathologic process. Soft tissues and spinal canal: No prevertebral fluid or swelling. No visible canal hematoma. Disc levels: Degenerative disc disease at C4-5 with disc space narrowing and spurring. Upper chest: Posterior right 1st rib fracture noted. There appears to be a right effusion partially imaged. See chest CT report for full discussion. Other: None IMPRESSION: No acute intracranial abnormality. Fracture through the posterior right first rib. Probable right pleural effusion. No acute bony abnormality in the cervical spine. Electronically Signed   By: Charlett Nose M.D.   On: 07/26/2019  21:05   Ct Chest W Contrast  Addendum Date: 07/26/2019   ADDENDUM REPORT: 07/26/2019 21:27 ADDENDUM: A minimally displaced proximal right first rib fracture is present. No additional rib fractures are present. Electronically Signed   By: Marin Roberts M.D.   On: 07/26/2019 21:27   Result Date: 07/26/2019 CLINICAL DATA:  Abd trauma, blunt, stable. Hit by tree branch. Fell from tree. EXAM: CT CHEST, ABDOMEN, AND PELVIS WITH CONTRAST TECHNIQUE: Multidetector CT imaging of the chest, abdomen and pelvis was performed following the standard protocol during bolus administration of intravenous  contrast. CONTRAST:  OMNIPAQUE IOHEXOL 300 MG/ML  SOLN COMPARISON:  None. FINDINGS: CT CHEST FINDINGS Cardiovascular: Heart size is normal. In aorta and great vessel origins are within normal limits. Pulmonary arteries are normal. There is no significant pericardial effusion or trauma. Mediastinum/Nodes: Significant mediastinal, hilar, or axillary adenopathy is present. There is no significant mediastinal effusion or air. Lungs/Pleura: Minimal dependent atelectasis is present. Lungs are otherwise clear without focal nodule, mass, or airspace disease. Is no pulmonary contusion. No pneumothorax is present. No significant pleural effusions are present. Musculoskeletal: 12 rib-bearing thoracic type vertebral bodies are present. No acute or healing fractures are present. Ribs are unremarkable. CT ABDOMEN PELVIS FINDINGS Hepatobiliary: Hemorrhage is present along the posterior margin of the liver. No focal hepatic contusion is present. The common bile duct and gallbladder normal. Pancreas: Unremarkable. No pancreatic ductal dilatation or surrounding inflammatory changes. Spleen: No splenic injury or perisplenic hematoma. Adrenals/Urinary Tract: There is diffuse hemorrhage about the right adrenal gland. Focal collection is present. Left adrenal gland is normal. Simple cyst in the left kidney measures 5.3 x 4.1 x 4.3 cm. Kidneys and ureters are otherwise within normal limits bilaterally. The urinary bladder is within normal limits. Stomach/Bowel: Stomach and duodenum are within normal limits. Small bowel is unremarkable. Terminal ileum is normal. The appendix is visualized and normal. The ascending and transverse colon are normal. Descending and sigmoid colon are within normal limits. Vascular/Lymphatic: Scattered atherosclerotic calcifications are present. There is no aneurysm. No significant retroperitoneal adenopathy is present. Reproductive: Prostate is unremarkable. Other: No abdominal wall hernia or abnormality.  No abdominopelvic ascites. Musculoskeletal: Left transverse process fractures are present at L1, L2, and L3. Fracture fragments are displaced posteriorly 1 shaft width. Vertebral body heights are maintained. No additional fractures present in the spine. Sacrum is intact. A comminuted left femoral neck fracture is present. Varus angulation is noted. Right hip is located. Left acetabulum is intact. IMPRESSION: 1. Right adrenal hemorrhage. 2. Hemorrhage extending into Morrison's pouch and about the free edge of the liver posteriorly. No definite hemorrhagic contusion is present. 3. Left L1, L2, and L3 transverse process fractures 4. Comminuted left femoral neck fracture. Electronically Signed: By: Marin Roberts M.D. On: 07/26/2019 21:18   Ct Cervical Spine Wo Contrast  Result Date: 07/26/2019 CLINICAL DATA:  Fall from tree. EXAM: CT HEAD WITHOUT CONTRAST CT CERVICAL SPINE WITHOUT CONTRAST TECHNIQUE: Multidetector CT imaging of the head and cervical spine was performed following the standard protocol without intravenous contrast. Multiplanar CT image reconstructions of the cervical spine were also generated. COMPARISON:  None. FINDINGS: CT HEAD FINDINGS Brain: No acute intracranial abnormality. Specifically, no hemorrhage, hydrocephalus, mass lesion, acute infarction, or significant intracranial injury. Vascular: No hyperdense vessel or unexpected calcification. Skull: No acute calvarial abnormality. Sinuses/Orbits: Visualized paranasal sinuses and mastoids clear. Orbital soft tissues unremarkable. Other: None CT CERVICAL SPINE FINDINGS Alignment: Normal Skull base and vertebrae: No acute fracture. No primary bone lesion or  focal pathologic process. Soft tissues and spinal canal: No prevertebral fluid or swelling. No visible canal hematoma. Disc levels: Degenerative disc disease at C4-5 with disc space narrowing and spurring. Upper chest: Posterior right 1st rib fracture noted. There appears to be a right  effusion partially imaged. See chest CT report for full discussion. Other: None IMPRESSION: No acute intracranial abnormality. Fracture through the posterior right first rib. Probable right pleural effusion. No acute bony abnormality in the cervical spine. Electronically Signed   By: Charlett Nose M.D.   On: 07/26/2019 21:05   Ct Abdomen Pelvis W Contrast  Addendum Date: 07/26/2019   ADDENDUM REPORT: 07/26/2019 21:27 ADDENDUM: A minimally displaced proximal right first rib fracture is present. No additional rib fractures are present. Electronically Signed   By: Marin Roberts M.D.   On: 07/26/2019 21:27   Result Date: 07/26/2019 CLINICAL DATA:  Abd trauma, blunt, stable. Hit by tree branch. Fell from tree. EXAM: CT CHEST, ABDOMEN, AND PELVIS WITH CONTRAST TECHNIQUE: Multidetector CT imaging of the chest, abdomen and pelvis was performed following the standard protocol during bolus administration of intravenous contrast. CONTRAST:  OMNIPAQUE IOHEXOL 300 MG/ML  SOLN COMPARISON:  None. FINDINGS: CT CHEST FINDINGS Cardiovascular: Heart size is normal. In aorta and great vessel origins are within normal limits. Pulmonary arteries are normal. There is no significant pericardial effusion or trauma. Mediastinum/Nodes: Significant mediastinal, hilar, or axillary adenopathy is present. There is no significant mediastinal effusion or air. Lungs/Pleura: Minimal dependent atelectasis is present. Lungs are otherwise clear without focal nodule, mass, or airspace disease. Is no pulmonary contusion. No pneumothorax is present. No significant pleural effusions are present. Musculoskeletal: 12 rib-bearing thoracic type vertebral bodies are present. No acute or healing fractures are present. Ribs are unremarkable. CT ABDOMEN PELVIS FINDINGS Hepatobiliary: Hemorrhage is present along the posterior margin of the liver. No focal hepatic contusion is present. The common bile duct and gallbladder normal. Pancreas:  Unremarkable. No pancreatic ductal dilatation or surrounding inflammatory changes. Spleen: No splenic injury or perisplenic hematoma. Adrenals/Urinary Tract: There is diffuse hemorrhage about the right adrenal gland. Focal collection is present. Left adrenal gland is normal. Simple cyst in the left kidney measures 5.3 x 4.1 x 4.3 cm. Kidneys and ureters are otherwise within normal limits bilaterally. The urinary bladder is within normal limits. Stomach/Bowel: Stomach and duodenum are within normal limits. Small bowel is unremarkable. Terminal ileum is normal. The appendix is visualized and normal. The ascending and transverse colon are normal. Descending and sigmoid colon are within normal limits. Vascular/Lymphatic: Scattered atherosclerotic calcifications are present. There is no aneurysm. No significant retroperitoneal adenopathy is present. Reproductive: Prostate is unremarkable. Other: No abdominal wall hernia or abnormality. No abdominopelvic ascites. Musculoskeletal: Left transverse process fractures are present at L1, L2, and L3. Fracture fragments are displaced posteriorly 1 shaft width. Vertebral body heights are maintained. No additional fractures present in the spine. Sacrum is intact. A comminuted left femoral neck fracture is present. Varus angulation is noted. Right hip is located. Left acetabulum is intact. IMPRESSION: 1. Right adrenal hemorrhage. 2. Hemorrhage extending into Morrison's pouch and about the free edge of the liver posteriorly. No definite hemorrhagic contusion is present. 3. Left L1, L2, and L3 transverse process fractures 4. Comminuted left femoral neck fracture. Electronically Signed: By: Marin Roberts M.D. On: 07/26/2019 21:18   Ct Wrist Left Wo Contrast  Result Date: 07/27/2019 CLINICAL DATA:  Evaluate left wrist EXAM: CT OF THE LEFT WRIST WITHOUT CONTRAST TECHNIQUE: Multidetector CT imaging was  performed according to the standard protocol. Multiplanar CT image  reconstructions were also generated. COMPARISON:  Radiograph same day FINDINGS: Bones/Joint/Cartilage There is been interval reduction the lunate dislocation. There is a tiny osseous fragments seen surrounding volar hand dorsal surface the lunate. There is comminuted intra-articular fracture seen radial styloid with small ossific fragments at the radius scaphoid joint space. The radioulnar joint appears fractures. There is diffuse soft tissue Ligaments Suboptimally assessed by CT. Muscles and Tendons The muscles and tendons to be grossly. Soft tissues Diffuse soft tissue swelling seen surrounding the wrist. IMPRESSION: 1. Comminuted intra-articular radial styloid fracture. 2. Interval reduction the tiny ossific fragments. Electronically Signed   By: Jonna ClarkBindu  Avutu M.D.   On: 07/27/2019 01:50   Ct Elbow Right Wo Contrast  Result Date: 07/27/2019 CLINICAL DATA:  Postreduction EXAM: CT OF THE LOWER RIGHT EXTREMITY WITHOUT CONTRAST TECHNIQUE: Multidetector CT imaging of the right lower extremity was performed according to the standard protocol. COMPARISON:  None. FINDINGS: Bones/Joint/Cartilage There is been interval reduction of the posterior elbow dislocation. Slight posterior tilt of the ulna at the trochlear surface. Intra-articular comminuted fracture medial epicondyle. Tiny nondisplaced fracture of the anterior coronoid. There is a moderate elbow effusion seen the radial does articulate the capitellum Ligaments Suboptimally assessed by CT. Muscles and Tendons The muscles appear to intact.  Visualization tendons Soft tissues Significant surrounding soft tissue posteriorly. IMPRESSION: Interval reduction of posterior elbow dislocation. There remains slight posterior tilt of the ulna at the trochlear surface. Comminuted fracture of the medial epicondyle and mildly tiny coronoid process fracture. Elbow joint effusion Electronically Signed   By: Jonna ClarkBindu  Avutu M.D.   On: 07/27/2019 02:02   Dg Chest Port 1  View  Result Date: 07/28/2019 CLINICAL DATA:  Pleural effusion status post fall EXAM: PORTABLE CHEST 1 VIEW COMPARISON:  CT chest 07/26/2019 FINDINGS: There is mild left basilar atelectasis. There is no focal consolidation. There is no pleural effusion or pneumothorax. The heart and mediastinal contours are unremarkable. The nondisplaced fracture at the tip of left twelfth rib is not well visualized on the current exam. There is otherwise no acute osseous abnormality. IMPRESSION: Mild left basilar atelectasis. Electronically Signed   By: Elige KoHetal  Patel   On: 07/28/2019 05:58   Dg Chest Port 1 View  Result Date: 07/26/2019 CLINICAL DATA:  Fall out of tree. EXAM: PORTABLE CHEST 1 VIEW COMPARISON:  None. FINDINGS: The heart size and mediastinal contours are within normal limits. Both lungs are clear. The visualized skeletal structures are unremarkable. IMPRESSION: No active disease. Electronically Signed   By: Charlett NoseKevin  Dover M.D.   On: 07/26/2019 20:42   Dg Knee Complete 4 Views Left  Result Date: 07/26/2019 CLINICAL DATA:  Knee pain after fall EXAM: LEFT KNEE - COMPLETE 4+ VIEW COMPARISON:  None. FINDINGS: No fracture or dislocation. Small knee joint effusion is seen. Soft tissue swelling seen around the medial aspect of the knee. The linear calcifications seen in the medial compartment of the knee. IMPRESSION: No acute osseous abnormality. Soft tissue swelling seen around the medial knee with a small knee joint effusion. 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 Electronically Signed   By: Jonna ClarkBindu  Avutu M.D.   On: 07/26/2019 21:57   Dg Hip Port Unilat With Pelvis 1v Left  Result Date: 07/26/2019 CLINICAL DATA:  Left hip pain following a fall out of a tree. EXAM: DG HIP (WITH OR WITHOUT PELVIS) 1V PORT LEFT COMPARISON:  None. FINDINGS: Left femoral neck fracture  with mild proximal displacement of the distal fragment and mild varus angulation. No other fractures seen. IMPRESSION: Left  femoral neck fracture, as described above. Electronically Signed   By: Claudie Revering M.D.   On: 07/26/2019 20:41    Positive ROS: All other systems have been reviewed and were otherwise negative with the exception of those mentioned in the HPI and as above.  Physical Exam: General: Alert, no acute distress Cardiovascular: No pedal edema Respiratory: No cyanosis, no use of accessory musculature GI: No organomegaly, abdomen is soft and non-tender Skin: No lesions in the area of chief complaint Neurologic: Sensation intact distally Psychiatric: Patient is competent for consent with normal mood and affect Lymphatic: No axillary or cervical lymphadenopathy  MUSCULOSKELETAL: Examination of the left hip reveals no skin wounds or lesions.  She is shortened and rotated.  He is able to wiggle his toes.  His foot is warm and well-perfused.  He reports intact sensation.  Assessment: Status post 20 foot fall.  Displaced left femoral neck fracture. Bilateral upper extremity injuries under the care of Dr. Amedeo Plenty.  Plan: I discussed the findings with the patient.  We discussed that the most predictable, long-lasting and durable operation for his left hip is a total hip replacement.  We discussed the risks, benefits, and alternatives.  Please see statement of risk.  After the hip portion of his procedure, Dr. Amedeo Plenty will be proceeding with bilateral upper extremity surgery.  Please see his note for further details.  All questions were solicited and answered.    Bertram Savin, MD Cell [phone]    07/28/2019 11:26 AM

## 2019-07-28 NOTE — Progress Notes (Signed)
OT Cancellation Note  Patient Details Name: Robert Hahn MRN: [id number] DOB: [birth year]   Cancelled Treatment:    Reason Eval/Treat Not Completed: Medical issues which prohibited therapy. Pt for surgery today on hip and arms--will follow up tomorrow for appropriateness of eval.  [place], OTR/L Acute Rehab Services Pager [phone] Office [phone]     Almon Register 07/28/2019, 8:51 AM

## 2019-07-29 ENCOUNTER — Encounter (HOSPITAL_COMMUNITY): Payer: Self-pay | Admitting: Orthopedic Surgery

## 2019-07-29 LAB — CBC
HCT: 34.5 % — ABNORMAL LOW (ref 39.0–52.0)
Hemoglobin: 11.6 g/dL — ABNORMAL LOW (ref 13.0–17.0)
MCH: 28.9 pg (ref 26.0–34.0)
MCHC: 33.6 g/dL (ref 30.0–36.0)
MCV: 86 fL (ref 80.0–100.0)
Platelets: 157 10*3/uL (ref 150–400)
RBC: 4.01 MIL/uL — ABNORMAL LOW (ref 4.22–5.81)
RDW: 13 % (ref 11.5–15.5)
WBC: 12.2 10*3/uL — ABNORMAL HIGH (ref 4.0–10.5)
nRBC: 0 % (ref 0.0–0.2)

## 2019-07-29 LAB — BASIC METABOLIC PANEL
Anion gap: 9 (ref 5–15)
BUN: 18 mg/dL (ref 6–20)
CO2: 21 mmol/L — ABNORMAL LOW (ref 22–32)
Calcium: 7.9 mg/dL — ABNORMAL LOW (ref 8.9–10.3)
Chloride: 106 mmol/L (ref 98–111)
Creatinine, Ser: 1.05 mg/dL (ref 0.61–1.24)
GFR calc Af Amer: >60 mL/min (ref >60)
GFR calc non Af Amer: >60 mL/min (ref >60)
Glucose, Bld: 128 mg/dL — ABNORMAL HIGH (ref 70–99)
Potassium: 4.2 mmol/L (ref 3.5–5.1)
Sodium: 136 mmol/L (ref 135–145)

## 2019-07-29 MED ORDER — ENOXAPARIN SODIUM 40 MG/0.4ML ~~LOC~~ SOLN: 40.0000 mg | SUBCUTANEOUS | 0 refills | Status: DC

## 2019-07-29 MED ORDER — METHOCARBAMOL 500 MG PO TABS
500.0000 mg | ORAL_TABLET | Freq: Three times a day (TID) | ORAL | Status: DC
  Administered 2019-07-29 – 2019-07-30 (×5): 500 mg via ORAL
  Filled 2019-07-29 (×5): qty 1

## 2019-07-29 MED ORDER — GABAPENTIN 100 MG PO CAPS
100.0000 mg | ORAL_CAPSULE | Freq: Three times a day (TID) | ORAL | Status: DC
  Administered 2019-07-29 – 2019-07-30 (×5): 100 mg via ORAL
  Filled 2019-07-29 (×5): qty 1

## 2019-07-29 MED ORDER — OXYCODONE HCL 5 MG PO TABS: 5.0000 mg | ORAL_TABLET | ORAL | 0 refills | Status: AC | PRN

## 2019-07-29 MED ORDER — HYDROMORPHONE HCL 1 MG/ML IJ SOLN: 1.0000 mg | INTRAMUSCULAR | Status: DC | PRN

## 2019-07-29 MED ORDER — ACETAMINOPHEN 325 MG PO TABS: 650.0000 mg | ORAL_TABLET | Freq: Four times a day (QID) | ORAL | 0 refills | Status: AC

## 2019-07-29 MED ORDER — OXYCODONE HCL 5 MG PO TABS
5.0000 mg | ORAL_TABLET | ORAL | Status: DC | PRN
  Administered 2019-07-29 – 2019-07-30 (×2): 10 mg via ORAL
  Filled 2019-07-29 (×2): qty 2

## 2019-07-29 MED ORDER — ACETAMINOPHEN 325 MG PO TABS
650.0000 mg | ORAL_TABLET | Freq: Four times a day (QID) | ORAL | Status: DC
  Administered 2019-07-29 – 2019-07-30 (×6): 650 mg via ORAL
  Filled 2019-07-29 (×6): qty 2

## 2019-07-29 NOTE — Progress Notes (Signed)
Patient ID: Robert Hahn, male   DOB: [birth year], 58 y.o.   MRN: [id number] Patient seen early this morning at bedside.  He is sensate on the right and left upper extremities.  Bandages are clean dry and intact.  At present juncture he is neurovascularly intact he has no signs of infection compartment syndrome or complication related to his surgical events.  I would recommend nonweightbearing about the right upper extremity and weightbearing through his left elbow only without weightbearing through his wrist or hand on the left side.  He understands this.  Moving forward will plan for RTC in 2 weeks with sutures out and therapy to follow.  At present juncture stable postop day 1.  Lizzeth Meder MD

## 2019-07-29 NOTE — Progress Notes (Signed)
    Subjective:  Patient reports pain as mild to moderate.  Denies N/V/CP/SOB. Ambulated 600 ft in hall with PT  Objective:   VITALS:   Vitals:   07/28/19 2343 07/29/19 0332 07/29/19 1249 07/29/19 1502  BP: (!) 145/59 119/82 (!) 159/76 (!) 157/71  Pulse:  93 (!) 107 99  Resp:  19 16 16   Temp: 97.8 F (36.6 C) 98.2 F (36.8 C) 99.6 F (37.6 C) 98.3 F (36.8 C)  TempSrc: Oral Oral Oral Oral  SpO2:  96% 93% 95%  Weight:      Height:        NAD ABD soft Sensation intact distally Intact pulses distally Dorsiflexion/Plantar flexion intact Incision: dressing C/D/I Compartment soft  BUE: splints intact. (+) AIN, PIN, U. BCR, SILT digits.  Lab Results  Component Value Date   WBC 12.2 (H) 07/29/2019   HGB 11.6 (L) 07/29/2019   HCT 34.5 (L) 07/29/2019   MCV 86.0 07/29/2019   PLT 157 07/29/2019   BMET    Component Value Date/Time   NA 136 07/29/2019 0302   K 4.2 07/29/2019 0302   CL 106 07/29/2019 0302   CO2 21 (L) 07/29/2019 0302   GLUCOSE 128 (H) 07/29/2019 0302   BUN 18 07/29/2019 0302   CREATININE 1.05 07/29/2019 0302   CALCIUM 7.9 (L) 07/29/2019 0302   GFRNONAA >60 07/29/2019 0302   GFRAA >60 07/29/2019 0302     Assessment/Plan: 1 Day Post-Op   Active Problems:   Fall   Closed fracture dislocation of right elbow joint, initial encounter   Left displaced femoral neck fracture (HCC)   Closed dislocation of perilunate joint, left, initial encounter   Adrenal hemorrhage ([place])   Lumbar transverse process fracture, closed, initial encounter ([place])   WBAT LLE with walker NWB BUE, may WB through L elbow DVT ppx: lovenox for 2 weeks then ASA 81 mg PO BID for 4 weeks , SCDs, TEDS PO pain control PT/OT Dispo: D/C planning    Hilton Cork Vincient Vanaman 07/29/2019, 3:34 PM   Rod Can, MD Cell: [phone] [place] [facility] now [facility]  [place] [address] [place], [place], [place] [address] Phone: [phone]  [email or web address] Facebook  [employer]

## 2019-07-29 NOTE — Evaluation (Signed)
Occupational Therapy Evaluation Patient Details Name: Robert Hahn MRN: [id number] DOB: September 11, 1961 Today's Date: 07/29/2019    History of Present Illness 58 y.o. male who fell 20 feet and sustained multiple orthopedic injuries.  He was found to have right elbow fracture dislocation, left lunate dislocation, displaced vertical left femoral neck fracture and lumbar transverse process fx as well as adrenal hemorrhage.  He underwent emergent closed reduction of the right elbow and left lunate dislocation. s/p L THA anterior approach, and R elbow relocation and L lunate dislocation relocation 9/8    Clinical Impression   Patient presenting with decreased I in self care, balance, functional mobility, transfers, and safety awareness. Patient reports being independent PTA. Patient currently functioning at total A overall for self care tasks secondary to precautions and B UE casts limiting functional use. Pt needing max A of 2 for bed mobility, mod of 2 for sit <>stand, and max A of 2 progressing to CGA for ambulation. Daughter present on eval. Patient will benefit from acute OT to increase overall independence in the areas of ADLs, functional mobility, and safety awareness in order to safely discharge home.    Follow Up Recommendations  Home health OT;Supervision/Assistance - 24 hour    Equipment Recommendations  None recommended by OT       Precautions / Restrictions Precautions Precautions: Fall Restrictions Weight Bearing Restrictions: Yes RUE Weight Bearing: Non weight bearing LUE Weight Bearing: Weight bear through elbow only LLE Weight Bearing: Weight bearing as tolerated Other Position/Activity Restrictions: Lumbar fx, no brace but very painful with movement taught back precautions to reduce pain       Mobility Bed Mobility Overal bed mobility: Needs Assistance Bed Mobility: Supine to Sit     Supine to sit: Max assist;+2 for physical assistance;HOB elevated     General bed  mobility comments: maxAx2 for management of LE across bed, trunk to upright and scooting hips to EoB, assist required to maintain UE weightbearing restrictions,   Transfers Overall transfer level: Needs assistance   Transfers: Sit to/from Stand Sit to Stand: Min assist;Mod assist;+2 physical assistance         General transfer comment: modA for power up to standing and steadying from elevated surface, assist requires as pt tenative about weightbearing through L LE, min A for guiding hips with getting back to bed    Balance Overall balance assessment: Needs assistance Sitting-balance support: Feet supported;No upper extremity supported Sitting balance-Leahy Scale: Good     Standing balance support: During functional activity;No upper extremity supported Standing balance-Leahy Scale: Fair          ADL either performed or assessed with clinical judgement   ADL Overall ADL's : Needs assistance/impaired             General ADL Comments: total A for all self care secondary to unable to utilize B UEs     Vision Baseline Vision/History: Wears glasses Wears Glasses: Reading only Patient Visual Report: No change from baseline Vision Assessment?: No apparent visual deficits            Pertinent Vitals/Pain Pain Assessment: Faces Pain Score: 4  Faces Pain Scale: Hurts whole lot Pain Location: low back, L hip, L wrist, R elbow Pain Descriptors / Indicators: Grimacing;Sharp;Shooting;Aching;Burning Pain Intervention(s): Monitored during session;Repositioned;Ice applied     Hand Dominance Right   Extremity/Trunk Assessment Upper Extremity Assessment Upper Extremity Assessment: (unable to assess secondary to NWB and in B casts)   Lower Extremity Assessment Lower Extremity Assessment: LLE  deficits/detail LLE Deficits / Details: ROM WFL, strength grossly 4/5  LLE: Unable to fully assess due to pain LLE Coordination: decreased fine motor   Cervical / Trunk  Assessment Cervical / Trunk Assessment: Other exceptions   Communication Communication Communication: No difficulties   Cognition Arousal/Alertness: Awake/alert Behavior During Therapy: Impulsive Overall Cognitive Status: Within Functional Limits for tasks assessed          General Comments: pt eager to move, somewhat impulsive with initial movement probably due to pain   General Comments  pt daughter present throughout session,             Home Living Family/patient expects to be discharged to:: Private residence Living Arrangements: Spouse/significant other Available Help at Discharge: Family;Available 24 hours/day Type of Home: House Home Access: Stairs to enter Enter[employer] CorporationEntrance Stairs-Number of Steps: 3   Home Layout: One level     Bathroom Shower/Tub: Tub/shower unit         Home Equipment: None          Prior Functioning/Environment Level of Independence: Independent                 OT Problem List: Decreased strength;Decreased knowledge of use of DME or AE;Decreased knowledge of precautions;Decreased activity tolerance;Cardiopulmonary status limiting activity;Impaired balance (sitting and/or standing);Decreased safety awareness;Pain      OT Treatment/Interventions: Self-care/ADL training;Balance training;Therapeutic exercise;Therapeutic activities;Energy conservation;Patient/family education;DME and/or AE instruction    OT Goals(Current goals can be found in the care plan section) Acute Rehab OT Goals Patient Stated Goal: get back to working OT Goal Formulation: With patient/family Time For Goal Achievement: 08/12/19 Potential to Achieve Goals: Good ADL Goals Pt Will Transfer to Toilet: with min guard assist Pt Will Perform Tub/Shower Transfer: with min guard assist  OT Frequency: Min 2X/week   Barriers to D/C:    none known at this time       Co-evaluation PT/OT/SLP Co-Evaluation/Treatment: Yes Reason for Co-Treatment: Complexity of the  patient's impairments (multi-system involvement);To address functional/ADL transfers   OT goals addressed during session: (functional mobility)      AM-PAC OT "6 Clicks" Daily Activity     Outcome Measure Help from another person eating meals?: Total Help from another person taking care of personal grooming?: Total Help from another person toileting, which includes using toliet, bedpan, or urinal?: Total Help from another person bathing (including washing, rinsing, drying)?: Total Help from another person to put on and taking off regular upper body clothing?: Total Help from another person to put on and taking off regular lower body clothing?: Total 6 Click Score: 6   End of Session Nurse Communication: Mobility status;Precautions  Activity Tolerance: Patient tolerated treatment well Patient left: in chair;with call bell/phone within reach;with family/visitor present  OT Visit Diagnosis: History of falling (Z91.81);Muscle weakness (generalized) (M62.81);Pain Pain - Right/Left: Left Pain - part of body: Shoulder;Hip;Leg                Time: 1311-1400 OT Time Calculation (min): 49 min Charges:  OT General Charges $OT Visit: 1 Visit OT Evaluation $OT Eval Moderate Complexity: 1 Mod  Marajade Lei P, MS, OTR/L 07/29/2019, 4:08 PM

## 2019-07-29 NOTE — Progress Notes (Signed)
[facility] Progress Note  1 Day Post-Op  Subjective: CC: discomfort Patient reports he is just somewhat uncomfortable this AM. Some mild chest pain with taking deep breaths. BL upper extremities in splints. Patient really wants to go home from the hospital if able. Denies abdominal pain or nausea.  Objective: Vital signs in last 24 hours: Temp:  [97.8 F (36.6 C)-98.2 F (36.8 C)] 98.2 F (36.8 C) (09/09 0332) Pulse Rate:  [75-98] 93 (09/09 0332) Resp:  [11-23] 19 (09/09 0332) BP: (119-162)/(53-85) 119/82 (09/09 0332) SpO2:  [91 %-99 %] 96 % (09/09 0332) Weight:  [83 kg] 83 kg (09/08 1106) Last BM Date: 07/27/19  Intake/Output from previous day: 09/08 0701 - 09/09 0700 In: 3660 [P.O.:580; I.V.:3000] Out: 2010 [Urine:1650; Blood:360] Intake/Output this shift: No intake/output data recorded.  PE: Gen:  Alert, NAD, pleasant Card:  Regular rate and rhythm, pedal pulses 2+ BL Pulm:  Normal effort, clear to auscultation bilaterally Abd: Soft, non-tender, non-distended Ext: BL UE in splints, fingers WWP bilaterally, sensation/motor intact in fingers bilaterally; L hip dressing c/d/i Skin: warm and dry, no rashes  Psych: A&Ox3   Lab Results:  Recent Labs    07/28/19 0320 07/29/19 0302  WBC 10.1 12.2*  HGB 14.8 11.6*  HCT 42.1 34.5*  PLT 154 157   BMET Recent Labs    07/28/19 0320 07/29/19 0302  NA 137 136  K 3.3* 4.2  CL 106 106  CO2 21* 21*  GLUCOSE 145* 128*  BUN 10 18  CREATININE 1.05 1.05  CALCIUM 8.8* 7.9*   PT/INR Recent Labs    07/26/19 2001  LABPROT 13.7  INR 1.1   CMP     Component Value Date/Time   NA 136 07/29/2019 0302   K 4.2 07/29/2019 0302   CL 106 07/29/2019 0302   CO2 21 (L) 07/29/2019 0302   GLUCOSE 128 (H) 07/29/2019 0302   BUN 18 07/29/2019 0302   CREATININE 1.05 07/29/2019 0302   CALCIUM 7.9 (L) 07/29/2019 0302   PROT 6.1 (L) 07/26/2019 2001   ALBUMIN 3.9 07/26/2019 2001   AST 106 (H) 07/26/2019 2001   ALT  111 (H) 07/26/2019 2001   ALKPHOS 56 07/26/2019 2001   BILITOT 0.7 07/26/2019 2001   GFRNONAA >60 07/29/2019 0302   GFRAA >60 07/29/2019 0302   Lipase  No results found for: LIPASE     Studies/Results: Pelvis Portable  Result Date: 07/28/2019 CLINICAL DATA:  Status post hip replacement EXAM: PORTABLE PELVIS 1-2 VIEWS COMPARISON:  07/26/2019 FINDINGS: Interval left hip replacement with intact hardware and normal alignment. Gas within the soft tissues of the left hip and thigh consistent with recent surgery. IMPRESSION: Status post left hip replacement with expected postsurgical changes Electronically Signed   By: Donavan Foil M.D.   On: 07/28/2019 19:55   Dg Chest Port 1 View  Result Date: 07/28/2019 CLINICAL DATA:  Pleural effusion status post fall EXAM: PORTABLE CHEST 1 VIEW COMPARISON:  CT chest 07/26/2019 FINDINGS: There is mild left basilar atelectasis. There is no focal consolidation. There is no pleural effusion or pneumothorax. The heart and mediastinal contours are unremarkable. The nondisplaced fracture at the tip of left twelfth rib is not well visualized on the current exam. There is otherwise no acute osseous abnormality. IMPRESSION: Mild left basilar atelectasis. Electronically Signed   By: Kathreen Devoid   On: 07/28/2019 05:58   Dg C-arm 1-60 Min  Result Date: 07/28/2019 CLINICAL DATA:  Left hip replacement EXAM: DG C-ARM 1-60 MIN;  OPERATIVE LEFT HIP WITH PELVIS COMPARISON:  None. FLUOROSCOPY TIME:  Fluoroscopy Time:  16 seconds Radiation Exposure Index (if provided by the fluoroscopic device): 2.02 mGy Number of Acquired Spot Images: 2 FINDINGS: Left hip prosthesis is noted in place. Cerclage wire is noted surrounding the proximal femur. IMPRESSION: Status post left hip replacement Electronically Signed   By: Alcide CleverMark  Lukens M.D.   On: 07/28/2019 14:21   Dg Hip Operative Unilat W Or W/o Pelvis Left  Result Date: 07/28/2019 CLINICAL DATA:  Left hip replacement EXAM: DG C-ARM 1-60  MIN; OPERATIVE LEFT HIP WITH PELVIS COMPARISON:  None. FLUOROSCOPY TIME:  Fluoroscopy Time:  16 seconds Radiation Exposure Index (if provided by the fluoroscopic device): 2.02 mGy Number of Acquired Spot Images: 2 FINDINGS: Left hip prosthesis is noted in place. Cerclage wire is noted surrounding the proximal femur. IMPRESSION: Status post left hip replacement Electronically Signed   By: Alcide CleverMark  Lukens M.D.   On: 07/28/2019 14:21    Anti-infectives: Anti-infectives (From admission, onward)   Start     Dose/Rate Route Frequency Ordered Stop   07/28/19 2030  ceFAZolin (ANCEF) IVPB 2g/100 mL premix     2 g 200 mL/hr over 30 Minutes Intravenous Every 6 hours 07/28/19 2021 07/29/19 0544   07/28/19 0600  ceFAZolin (ANCEF) IVPB 2g/100 mL premix     2 g 200 mL/hr over 30 Minutes Intravenous On call to O.R. 07/27/19 1926 07/28/19 1623       Assessment/Plan Fall, 20 feet Right elbowfracture/dislocation - s/p operative repair 9/8, per Dr. Amanda PeaGramig, NWB RUE Left wristfx - s/p operative repair 9/8, per Dr. Amanda PeaGramig, may WBAT through L elbow Left femoral neck fxfracture -s/p left anterior hip replacement 9/8, per Dr. Linna CapriceSwinteck, WBAT Lumbar transverse process fractures- Dr. Lovell SheehanJenkins- no treatment necessary Adrenal hemorrhage-no treatment indicated, hgb 11.6, VSS Right posterior 1st rib fx- pain control.  CXR stable  FEN - reg diet, IVF VTE - lovenox ID - Ancef 9/8>>  Dispo: PT/OT, pain control.   LOS: 3 days    Wells GuilesKelly Rayburn , [facility] 07/29/2019, 7:53 AM Pager: [phone]

## 2019-07-29 NOTE — Evaluation (Signed)
Physical Therapy Evaluation Patient Details Name: Robert Hahn MRN: [id number] DOB: 31-Dec-1960 Today's Date: 07/29/2019   History of Present Illness  58 y.o. male who fell 20 feet and sustained multiple orthopedic injuries.  He was found to have right elbow fracture dislocation, left lunate dislocation, displaced vertical left femoral neck fracture and lumbar transverse process fx as well as adrenal hemorrhage.  He underwent emergent closed reduction of the right elbow and left lunate dislocation. s/p L THA anterior approach, and R elbow relocation and L lunate dislocation relocation 9/8   Clinical Impression  PTA pt living with wife in single story home with 3 steps to enter. Pt completely independent working 60 hrs/wk. Pt is currently limited in safe mobility by pain especially in low back, L hip and bilateral UE especially during transfers. Pt is currently maxAx2 for bed mobility, modAx2 for transfers, maxAx2 progressing to min guard with ambulation of 500 feet. Pt educated in movement patterns to reduce back pain especially with getting back to bed. PT recommending HHPT level rehab at d/c mainly for problem solving weightbearing in home environment. PT will see tomorrow for stair training before d/c.     Follow Up Recommendations Home health PT;Supervision/Assistance - 24 hour(24 hour assist initially)    Equipment Recommendations  None recommended by PT       Precautions / Restrictions Precautions Precautions: Fall Restrictions Weight Bearing Restrictions: Yes RUE Weight Bearing: Non weight bearing LUE Weight Bearing: Weight bear through elbow only LLE Weight Bearing: Weight bearing as tolerated Other Position/Activity Restrictions: Lumbar fx, no brace but very painful with movement taught back precautions to reduce pain       Mobility  Bed Mobility Overal bed mobility: Needs Assistance Bed Mobility: Supine to Sit     Supine to sit: Max assist;+2 for physical assistance;HOB  elevated     General bed mobility comments: maxAx2 for management of LE across bed, trunk to upright and scooting hips to EoB, assist required to maintain UE weightbearing restrictions,   Transfers Overall transfer level: Needs assistance   Transfers: Sit to/from Stand Sit to Stand: Min assist;Mod assist;+2 physical assistance         General transfer comment: modA for power up to standing and steadying from elevated surface, assist requires as pt tenative about weightbearing through L LE, min A for guiding hips with getting back to bed  Ambulation/Gait Ambulation/Gait assistance: Max assist;Min guard Gait Distance (Feet): 550 Feet Assistive device: None Gait Pattern/deviations: Step-through pattern;Decreased step length - right;Decreased step length - left;Shuffle;Antalgic Gait velocity: slowed Gait velocity interpretation: <1.8 ft/sec, indicate of risk for recurrent falls General Gait Details: maxAx2 progressing to min guard over 550 feet, pt with reduced weightbearing through L LE         Balance Overall balance assessment: Needs assistance Sitting-balance support: Feet supported;No upper extremity supported Sitting balance-Leahy Scale: Good     Standing balance support: During functional activity;No upper extremity supported Standing balance-Leahy Scale: Fair                               Pertinent Vitals/Pain Pain Assessment: Faces Pain Score: 4  Faces Pain Scale: Hurts whole lot Pain Location: low back, L hip, L wrist, R elbow Pain Descriptors / Indicators: Grimacing;Sharp;Shooting;Aching;Burning Pain Intervention(s): Premedicated before session;Monitored during session;Limited activity within patient's tolerance;Repositioned;Ice applied    Home Living Family/patient expects to be discharged to:: Private residence Living Arrangements: Spouse/significant other Available Help at  Discharge: Family;Available 24 hours/day(24 hours initially) Type of  Home: House Home Access: Stairs to enter   Technical brewer of Steps: 3 Home Layout: One level Home Equipment: None      Prior Function Level of Independence: Independent               Hand Dominance   Dominant Hand: Right    Extremity/Trunk Assessment   Upper Extremity Assessment Upper Extremity Assessment: Defer to OT evaluation    Lower Extremity Assessment Lower Extremity Assessment: LLE deficits/detail LLE Deficits / Details: ROM WFL, strength grossly 4/5  LLE: Unable to fully assess due to pain LLE Coordination: decreased fine motor    Cervical / Trunk Assessment Cervical / Trunk Assessment: Other exceptions(lumbar fx causing increased pain)  Communication   Communication: No difficulties  Cognition Arousal/Alertness: Awake/alert Behavior During Therapy: Impulsive Overall Cognitive Status: Within Functional Limits for tasks assessed                                 General Comments: pt eager to move, somewhat impulsive with initial movement probably due to pain      General Comments General comments (skin integrity, edema, etc.): pt daughter present throughout session,         Assessment/Plan    PT Assessment Patient needs continued PT services  PT Problem List Decreased range of motion;Decreased balance;Decreased mobility;Pain;Decreased coordination       PT Treatment Interventions Gait training;Stair training;Functional mobility training;Therapeutic activities;Therapeutic exercise;Balance training;Patient/family education    PT Goals (Current goals can be found in the Care Plan section)  Acute Rehab PT Goals Patient Stated Goal: get back to working PT Goal Formulation: With patient/family Time For Goal Achievement: 08/12/19 Potential to Achieve Goals: Good    Frequency 7X/week    AM-PAC PT "6 Clicks" Mobility  Outcome Measure Help needed turning from your back to your side while in a flat bed without using bedrails?:  A Little Help needed moving from lying on your back to sitting on the side of a flat bed without using bedrails?: Total Help needed moving to and from a bed to a chair (including a wheelchair)?: A Lot Help needed standing up from a chair using your arms (e.g., wheelchair or bedside chair)?: A Lot Help needed to walk in hospital room?: A Little Help needed climbing 3-5 steps with a railing? : A Little 6 Click Score: 14    End of Session Equipment Utilized During Treatment: Gait belt Activity Tolerance: Patient limited by pain;Patient tolerated treatment well Patient left: in chair;with call bell/phone within reach;with nursing/sitter in room Nurse Communication: Mobility status;Weight bearing status PT Visit Diagnosis: Unsteadiness on feet (R26.81);Other abnormalities of gait and mobility (R26.89);Difficulty in walking, not elsewhere classified (R26.2);Pain Pain - part of body: Leg;Arm(back )    Time: 1093-2355 PT Time Calculation (min) (ACUTE ONLY): 40 min   Charges:   PT Evaluation $PT Eval Moderate Complexity: 1 Mod PT Treatments $Gait Training: 8-22 mins        Derral Colucci B. Migdalia Dk PT, DPT Acute Rehabilitation Services Pager [phone] Office [phone]   [place] 07/29/2019, 3:38 PM

## 2019-07-30 LAB — CBC
HCT: 31.6 % — ABNORMAL LOW (ref 39.0–52.0)
Hemoglobin: 10.6 g/dL — ABNORMAL LOW (ref 13.0–17.0)
MCH: 29 pg (ref 26.0–34.0)
MCHC: 33.5 g/dL (ref 30.0–36.0)
MCV: 86.6 fL (ref 80.0–100.0)
Platelets: 161 10*3/uL (ref 150–400)
RBC: 3.65 MIL/uL — ABNORMAL LOW (ref 4.22–5.81)
RDW: 13 % (ref 11.5–15.5)
WBC: 7.1 10*3/uL (ref 4.0–10.5)
nRBC: 0 % (ref 0.0–0.2)

## 2019-07-30 LAB — BASIC METABOLIC PANEL
Anion gap: 8 (ref 5–15)
BUN: 17 mg/dL (ref 6–20)
CO2: 21 mmol/L — ABNORMAL LOW (ref 22–32)
Calcium: 7.9 mg/dL — ABNORMAL LOW (ref 8.9–10.3)
Chloride: 107 mmol/L (ref 98–111)
Creatinine, Ser: 0.96 mg/dL (ref 0.61–1.24)
GFR calc Af Amer: >60 mL/min (ref >60)
GFR calc non Af Amer: >60 mL/min (ref >60)
Glucose, Bld: 112 mg/dL — ABNORMAL HIGH (ref 70–99)
Potassium: 3.7 mmol/L (ref 3.5–5.1)
Sodium: 136 mmol/L (ref 135–145)

## 2019-07-30 MED ORDER — METHOCARBAMOL 500 MG PO TABS: 500.0000 mg | ORAL_TABLET | Freq: Three times a day (TID) | ORAL | 0 refills | Status: DC

## 2019-07-30 MED ORDER — ENOXAPARIN (LOVENOX) PATIENT EDUCATION KIT
PACK | Freq: Once | Status: AC
  Administered 2019-07-30: 19:00:00
  Filled 2019-07-30: qty 1

## 2019-07-30 MED ORDER — GABAPENTIN 100 MG PO CAPS: 100.0000 mg | ORAL_CAPSULE | Freq: Three times a day (TID) | ORAL | 0 refills | Status: DC

## 2019-07-30 NOTE — Discharge Instructions (Signed)
nonweightbaring to right upper extremity and left lower extremity Weight baring through elbow on left upper extremity   Rib Fracture  A rib fracture is a break or crack in one of the bones of the ribs. The ribs are like a cage that goes around your upper chest. A broken or cracked rib is often painful, but most do not cause other problems. Most rib fractures usually heal on their own in 1-3 months. Follow these instructions at home: Managing pain, stiffness, and swelling  If directed, apply ice to the injured area. ? Put ice in a plastic bag. ? Place a towel between your skin and the bag. ? Leave the ice on for 20 minutes, 2-3 times a day.  Take over-the-counter and prescription medicines only as told by your doctor. Activity  Avoid activities that cause pain to the injured area. Protect your injured area.  Slowly increase activity as told by your doctor. General instructions  Do deep breathing as told by your doctor. You may be told to: ? Take deep breaths many times a day. ? Cough many times a day while hugging a pillow. ? Use a device (incentive spirometer) to do deep breathing many times a day.  Drink enough fluid to keep your pee (urine) clear or pale yellow.  Do not wear a rib belt or binder. These do not allow you to breathe deeply.  Keep all follow-up visits as told by your doctor. This is important. Contact a doctor if:  You have a fever. Get help right away if:  You have trouble breathing.  You are short of breath.  You cannot stop coughing.  You cough up thick or bloody spit (sputum).  You feel sick to your stomach (nauseous), throw up (vomit), or have belly (abdominal) pain.  Your pain gets worse and medicine does not help. Summary  A rib fracture is a break or crack in one of the bones of the ribs.  Apply ice to the injured area and take medicines for pain as told by your doctor.  Take deep breaths and cough many times a day. Hug a pillow every time  you cough. This information is not intended to replace advice given to you by your health care provider. Make sure you discuss any questions you have with your health care provider. Document Released: 08/14/2008 Document Revised: 10/18/2017 Document Reviewed: 02/05/2017 Elsevier Patient Education  2020 [employer]   Transverse Process Fracture  Bones of the spine (vertebrae) have portions that extend off to either side of the spine. These portions of bone are called transverse processes. A transverse process fracture, which is also called a rotation spine fracture, is a break in a transverse process. What are the causes? This condition may be caused by:  A fall from a great height.  A car accident.  A sports injury.  A gunshot wound.  A hard, direct hit to the back. This kind of fracture often results from a sudden and severe bending of the spine to one side. Depending on the cause of the fracture, one or more bones may be affected. What increases the risk? You are more likely to develop this condition if:  You have thinning and loss of density in the bones (osteoporosis).  You play a contact sport. What are the signs or symptoms? The main symptom of this condition is back pain. The pain may:  Be felt on the side of the spine (flank) where the fracture is.  Get worse when you  move or take a deep breath. How is this diagnosed? This condition may be diagnosed based on:  Your symptoms.  Your medical history.  A physical exam. You may also have other tests, including:  X-rays.  A CT scan.  MRI. How is this treated? Most transverse process fractures heal on their own with time and rest. Treatment may involve supportive care, such as:  Limiting activity.  Medicines, such as: ? Pain medicine. ? Muscle-relaxing medicine.  Physical therapy.  A neck or back brace. Follow these instructions at home: If you have a brace:  Wear the neck or back brace as told by  your health care provider. Remove it only as told by your health care provider.  Keep the brace clean.  If the brace is not waterproof: ? Do not let it get wet. ? Cover it with a watertight covering when you take a bath or a shower. Managing pain, stiffness, and swelling   If directed, put ice on the injured area: ? If you have a removable brace, remove it as told by your health care provider. ? Put ice in a plastic bag. ? Place a towel between your skin and the bag. ? Leave the ice on for 20 minutes, 2-3 times a day. Medicines  Take over-the-counter and prescription medicines only as told by your health care provider.  Do not drive or use heavy machinery while taking prescription pain medicine.  If you are taking prescription pain medicine, take actions to prevent or treat constipation. Your health care provider may recommend that you: ? Drink enough fluid to keep your urine pale yellow. ? Eat foods that are high in fiber, such as fresh fruits and vegetables, whole grains, and beans. ? Limit foods that are high in fat and processed sugars, such as fried or sweet foods. ? Take an over-the-counter or prescription medicine for constipation. Activity  Stay in bed (on bed rest) only as directed by your health care provider. ? Avoid being in bed for a long time without moving. Get up to take short walks every 1-2 hours. This is important to improve blood flow and breathing. Ask for help if you feel weak or unsteady.  Return to your normal activities when your health care provider says it is okay. Ask if there are any activities that you should not do.  Do physical therapy exercises as recommended by your health care provider. General instructions  Do not use any products that contain nicotine or tobacco, such as cigarettes and e-cigarettes. These can delay bone healing. If you need help quitting, ask your health care provider.  Keep all follow-up visits as told by your health care  provider. This is important. Visits can help to prevent permanent injury, disability, and long-lasting (chronic) pain. Contact a health care provider if:  You have a fever.  You develop a cough that makes your pain worse.  Your pain medicine is not helping.  Your pain does not get better over time.  You cannot return to your normal activities as planned or expected. Get help right away if:  Your pain is very bad and it suddenly gets worse.  You are unable to move any body part (paralysis) that is below the level of your injury.  You have numbness, tingling, or weakness in any body part that is below the level of your injury.  You cannot control your bladder or bowels. Summary  A transverse process fracture is a break in the portion of the bone  that extends to the side of the spine.  Most transverse process fractures heal on their own with time and rest.  You may also have supportive treatments such as a back brace, pain medicines, and physical therapy.  Keep all follow-up visits. This is important and will help to prevent permanent injury, disability, and long-lasting (chronic) pain. This information is not intended to replace advice given to you by your health care provider. Make sure you discuss any questions you have with your health care provider. Document Released: 02/20/2007 Document Revised: 12/18/2017 Document Reviewed: 12/18/2017 Elsevier Patient Education  2020 [employer].

## 2019-07-30 NOTE — Progress Notes (Signed)
Patient ID: Robert Hahn, male   DOB: [birth year], 58 y.o.   MRN: [id number] Excellent progress.  Patient has no complications regarding his upper extremity surgery.  He is status post right elbow reconstruction and left wrist reconstruction.  Finger range of motion looks great.  No signs of infection no signs of dystrophy.  We will continue immobilization of the affected extremities.  I discussed all issues with the patient and he understands the postoperative plans.  He is stable from my standpoint for discharge once he is able and ready.  Adaptive needs for transition to home to be addressed with therapy  All questions encouraged and answered.  Postop day 2 status post right elbow bone and ligamentous reconstruction with ulnar nerve release and left wrist scapholunate reconstruction with distal radius fracture treatment and soft tissue reconstruction  Jarica Plass MD

## 2019-07-30 NOTE — Progress Notes (Signed)
    Durable Medical Equipment  (From admission, onward)         Start     Ordered   07/30/19 0829  For home use only DME Hospital bed  Once    Question Answer Comment  Length of Need 6 Months   The above medical condition requires: Patient requires the ability to reposition frequently   Bed type Semi-electric      07/30/19 0828         Governor Rooks, PTA Acute Rehabilitation Services Pager [phone] Office [phone]

## 2019-07-30 NOTE — Progress Notes (Signed)
Physical Therapy Treatment Patient Details Name: Robert Hahn MRN: [id number] DOB: 1961/06/07 Today's Date: 07/30/2019    History of Present Illness 58 y.o. male who fell 20 feet and sustained multiple orthopedic injuries.  He was found to have right elbow fracture dislocation, left lunate dislocation, displaced vertical left femoral neck fracture and lumbar transverse process fx as well as adrenal hemorrhage.  He underwent emergent closed reduction of the right elbow and left lunate dislocation. s/p L THA anterior approach, and R elbow relocation and L lunate dislocation relocation 9/8     PT Comments    Pt is extraordinary in his ability to mobilize given all his injuries.   He is requiring moderate assistance to get to edge of bed, min assistance to stand, min assistance for stair training and min guard for ambulation.  Pt will require hospital bed and bedside commode for home use.  Plan next session for standing exercises and progression of mobility to tolerance.     Follow Up Recommendations  Home health PT;Supervision/Assistance - 24 hour     Equipment Recommendations  Hospital bed;3in1 (PT)    Recommendations for Other Services       Precautions / Restrictions Precautions Precautions: Fall Restrictions Weight Bearing Restrictions: Yes RUE Weight Bearing: Non weight bearing LUE Weight Bearing: Weight bear through elbow only LLE Weight Bearing: Weight bearing as tolerated Other Position/Activity Restrictions: Lumbar fx, reports tightness no brace.    Mobility  Bed Mobility Overal bed mobility: Needs Assistance Bed Mobility: Supine to Sit     Supine to sit: Mod assist     General bed mobility comments: Pt able to mobilize LEs to edge of bed with HOB elevated he required moderate assistance to elevate trunk into sitting edge of bed.  Transfers Overall transfer level: Needs assistance Equipment used: None Transfers: Sit to/from Stand Sit to Stand: Min  assist;From elevated surface         General transfer comment: Elevate bed height for sit to stand transfers as he is B UE NWB.  Ambulation/Gait Ambulation/Gait assistance: Min guard Gait Distance (Feet): 500 Feet Assistive device: None Gait Pattern/deviations: Step-through pattern;Decreased step length - right;Decreased step length - left;Shuffle;Antalgic Gait velocity: slowed   General Gait Details: Pt with short stance time on L and decreased stride on R.  He presents with waddling pattern due to pain in L hip.   Stairs Stairs: Yes   Stair Management: No rails;Step to pattern;Forwards Number of Stairs: 4 General stair comments: Cues for sequencing and educated to not use hands on rails for safety to maintain BUE NWB.   Wheelchair Mobility    Modified Rankin (Stroke Patients Only)       Balance Overall balance assessment: Needs assistance   Sitting balance-Leahy Scale: Good       Standing balance-Leahy Scale: Fair                              Cognition Arousal/Alertness: Awake/alert Behavior During Therapy: WFL for tasks assessed/performed Overall Cognitive Status: Within Functional Limits for tasks assessed                                 General Comments: pt eager to move, somewhat impulsive with initial movement probably due to pain      Exercises General Exercises - Lower Extremity Ankle Circles/Pumps: AROM;Both;20 reps;Supine Quad Sets: AROM;Both;10 reps;Supine Heel Slides: AAROM;Left;10  reps;Supine Hip ABduction/ADduction: AAROM;Left;10 reps;Supine    General Comments        Pertinent Vitals/Pain Pain Assessment: Faces Pain Score: 4  Faces Pain Scale: Hurts little more Pain Location: back Pain Descriptors / Indicators: Sore Pain Intervention(s): Monitored during session;Repositioned    Home Living                      Prior Function            PT Goals (current goals can now be found in the care  plan section) Acute Rehab PT Goals Patient Stated Goal: get back to working PT Goal Formulation: With patient/family Potential to Achieve Goals: Good Progress towards PT goals: Progressing toward goals    Frequency           PT Plan      Co-evaluation              AM-PAC PT "6 Clicks" Mobility   Outcome Measure  Help needed turning from your back to your side while in a flat bed without using bedrails?: A Little Help needed moving from lying on your back to sitting on the side of a flat bed without using bedrails?: A Lot Help needed moving to and from a bed to a chair (including a wheelchair)?: A Little Help needed standing up from a chair using your arms (e.g., wheelchair or bedside chair)?: A Little Help needed to walk in hospital room?: A Little Help needed climbing 3-5 steps with a railing? : A Little 6 Click Score: 17    End of Session Equipment Utilized During Treatment: Gait belt Activity Tolerance: Patient limited by pain;Patient tolerated treatment well Patient left: in chair;with call bell/phone within reach;with nursing/sitter in room Nurse Communication: Mobility status;Weight bearing status PT Visit Diagnosis: Unsteadiness on feet (R26.81);Other abnormalities of gait and mobility (R26.89);Difficulty in walking, not elsewhere classified (R26.2);Pain Pain - part of body: Leg;Arm(back)     Time: [id number] PT Time Calculation (min) (ACUTE ONLY): 35 min  Charges:  $Gait Training: 8-22 mins $Therapeutic Exercise: 8-22 mins                     Joycelyn RuaAimee Rajan Burgard, PTA Acute Rehabilitation Services Pager [phone] Office [phone]     Chyann Ambrocio Artis DelayJ Cadee Agro 07/30/2019, 1:11 PM

## 2019-07-30 NOTE — Progress Notes (Signed)
Patient ID: Robert Hahn, male   DOB: 06-May-1961, 58 y.o.   MRN: [id number]    2 Days Post-Op  Subjective: Feels well today.  Pain is controlled.  Mobilized well with PT/OT yesterday.  Asking for hospital bed at home.  Eating well and passing flatus.  Objective: Vital signs in last 24 hours: Temp:  [98.3 F (36.8 C)-100 F (37.8 C)] 98.5 F (36.9 C) (09/10 0744) Pulse Rate:  [85-107] 85 (09/10 0744) Resp:  [15-17] 15 (09/10 0744) BP: (148-173)/(51-78) 167/78 (09/10 0744) SpO2:  [93 %-97 %] 97 % (09/10 0744) Last BM Date: 07/26/19  Intake/Output from previous day: 09/09 0701 - 09/10 0700 In: 240 [P.O.:240] Out: -  Intake/Output this shift: Total I/O In: 240 [P.O.:240] Out: -   PE: Gen: NAD Heart: regular LungS: CTAB Abd: soft, NT, ND, +BS Ext: RUE in splint through elbow, LUE in splint up to elbow.  Normal sensation in both hands, raises both arms, and wiggles all fingers.  Moves BLE, no edema in ankles and feet.  NVI  Lab Results:  Recent Labs    07/29/19 0302 07/30/19 0451  WBC 12.2* 7.1  HGB 11.6* 10.6*  HCT 34.5* 31.6*  PLT 157 161   BMET Recent Labs    07/29/19 0302 07/30/19 0451  NA 136 136  K 4.2 3.7  CL 106 107  CO2 21* 21*  GLUCOSE 128* 112*  BUN 18 17  CREATININE 1.05 0.96  CALCIUM 7.9* 7.9*   PT/INR No results for input(s): LABPROT, INR in the last 72 hours. CMP     Component Value Date/Time   NA 136 07/30/2019 0451   K 3.7 07/30/2019 0451   CL 107 07/30/2019 0451   CO2 21 (L) 07/30/2019 0451   GLUCOSE 112 (H) 07/30/2019 0451   BUN 17 07/30/2019 0451   CREATININE 0.96 07/30/2019 0451   CALCIUM 7.9 (L) 07/30/2019 0451   PROT 6.1 (L) 07/26/2019 2001   ALBUMIN 3.9 07/26/2019 2001   AST 106 (H) 07/26/2019 2001   ALT 111 (H) 07/26/2019 2001   ALKPHOS 56 07/26/2019 2001   BILITOT 0.7 07/26/2019 2001   GFRNONAA >60 07/30/2019 0451   GFRAA >60 07/30/2019 0451   Lipase  No results found for: LIPASE     Studies/Results:  Pelvis Portable  Result Date: 07/28/2019 CLINICAL DATA:  Status post hip replacement EXAM: PORTABLE PELVIS 1-2 VIEWS COMPARISON:  07/26/2019 FINDINGS: Interval left hip replacement with intact hardware and normal alignment. Gas within the soft tissues of the left hip and thigh consistent with recent surgery. IMPRESSION: Status post left hip replacement with expected postsurgical changes Electronically Signed   By: Robert PangKim  Hahn M.D.   On: 07/28/2019 19:55   Dg C-arm 1-60 Min  Result Date: 07/28/2019 CLINICAL DATA:  Left hip replacement EXAM: DG C-ARM 1-60 MIN; OPERATIVE LEFT HIP WITH PELVIS COMPARISON:  None. FLUOROSCOPY TIME:  Fluoroscopy Time:  16 seconds Radiation Exposure Index (if provided by the fluoroscopic device): 2.02 mGy Number of Acquired Spot Images: 2 FINDINGS: Left hip prosthesis is noted in place. Cerclage wire is noted surrounding the proximal femur. IMPRESSION: Status post left hip replacement Electronically Signed   By: Robert CleverMark  Hahn M.D.   On: 07/28/2019 14:21   Dg Hip Operative Unilat W Or W/o Pelvis Left  Result Date: 07/28/2019 CLINICAL DATA:  Left hip replacement EXAM: DG C-ARM 1-60 MIN; OPERATIVE LEFT HIP WITH PELVIS COMPARISON:  None. FLUOROSCOPY TIME:  Fluoroscopy Time:  16 seconds Radiation Exposure Index (if provided by  the fluoroscopic device): 2.02 mGy Number of Acquired Spot Images: 2 FINDINGS: Left hip prosthesis is noted in place. Cerclage wire is noted surrounding the proximal femur. IMPRESSION: Status post left hip replacement Electronically Signed   By: Robert Hahn M.D.   On: 07/28/2019 14:21    Anti-infectives: Anti-infectives (From admission, onward)   Start     Dose/Rate Route Frequency Ordered Stop   07/28/19 2030  ceFAZolin (ANCEF) IVPB 2g/100 mL premix     2 g 200 mL/hr over 30 Minutes Intravenous Every 6 hours 07/28/19 2021 07/29/19 0544   07/28/19 0600  ceFAZolin (ANCEF) IVPB 2g/100 mL premix     2 g 200 mL/hr over 30 Minutes Intravenous On call to O.R.  07/27/19 1926 07/28/19 1623       Assessment/Plan Fall, 20 feet Right elbowfracture/dislocation- s/p operative repair 9/8, per Dr. Amedeo Hahn, NWB RUE Left wristfx- s/p operative repair 9/8, per Dr. Amedeo Hahn, may WBAT through L elbow Left femoral neck fxfracture-s/p left anterior hip replacement 9/8, per Dr. Lyla Hahn, WBAT Lumbar transverse process fractures- Dr. Arnoldo Hahn- no treatment necessary Adrenal hemorrhage-no treatment indicated, hgb 11.6, VSS Right posterior 1st rib fx-pain control. CXR stable  FEN -reg diet, IVF VTE -lovenox will need at home for 2 weeks and then ASA 81 mg for 4 weeks ID -Ancef 9/8>> Dispo - work with therapies again, arrange for [facility] PT/OT/hospital bed.  Plan DC later today vs tomorrow pending [facility] set up   LOS: 4 days    Robert Hahn , [facility] 07/30/2019, 8:29 AM Pager: [phone]

## 2019-07-30 NOTE — TOC Transition Note (Signed)
Transition of Care [facility]) - CM/SW Discharge Note   Patient Details  Name: Robert Hahn MRN: [id number] Date of Birth: [birth year]  [facility]) CM/SW Contact:  Midge Minium RN, BSN, NCM-BC, ACM-RN [phone] Phone Number: 07/30/2019, 11:47 AM   Clinical Narrative:    CM following for dispositional needs. CM spoke to the patient to discuss the POC. The patient lived at home with his spouse and was independent with his ADLs PTA. PCP verified as: [place] Chard; Insurance: BCBS. Patient sustained multiple orthopedic injuries with repair. PT/OT eval complete with HHPT/OT and DME (hospital bed, BSC) recommended. CMS HH compare list discuss/DME preference with [employer] selected for DME services; [facility] selected for [facility] services. Patient stated his wife will be available to provide 24hr assist and will provide transportation home. [place] referral given to [facility] liaison); DME referral given to [facility]arta Codding [facility] liaison); AVS updated. Delivery of the DME will be coordinated with the patients spouse by the DME company. No further needs from CM.   Final next level of care: [place] Barriers to Discharge: No Barriers Identified   Patient Goals and CMS Choice Patient states their goals for this hospitalization and ongoing recovery are:: "to recover at home" CMS Medicare.gov Compare Post Acute Care list provided to:: Patient Choice offered to / list presented to : Patient   Discharge Plan and Services           DME Arranged: Hospital bed, Bedside commode DME Agency: [place] Date DME Agency Contacted: 07/30/19 Time DME Agency Contacted: [phone] Representative spoke with at DME Agency: Learta Codding (liaison) [place] Arranged: PT, OT [place] Agency: [facility] (formerly [employer]) Date [place]: 07/30/19 Time [place]: 1146 Representative spoke with at [place]: [place] (liaison)  Social Determinants of Health ([place])  Interventions     Readmission Risk Interventions No flowsheet data found.

## 2019-07-30 NOTE — Progress Notes (Addendum)
Physical Therapy Treatment Patient Details Name: Robert Hahn MRN: [id number] DOB: 1961/07/13 Today's Date: 07/30/2019    History of Present Illness 58 y.o. male who fell 20 feet and sustained multiple orthopedic injuries.  He was found to have right elbow fracture dislocation, left lunate dislocation, displaced vertical left femoral neck fracture and lumbar transverse process fx as well as adrenal hemorrhage.  He underwent emergent closed reduction of the right elbow and left lunate dislocation. s/p L THA anterior approach, and R elbow relocation and L lunate dislocation relocation 9/8     PT Comments    Pt performed gt training and function mobility with focus on sit to stand transfers and problem solving to improve ease.  PTA set up platform SPC on LLE.  With use of platform cane he is now able to stand with min guard assistance where he initially required heavy moderate assistance to achieve standing.  Pt fatigued after transfer training and requested back to bed.      Follow Up Recommendations  Home health PT;Supervision/Assistance - 24 hour     Equipment Recommendations  Hospital bed;3in1 (PT);Cane;Other (comment)(SPC and L platform attachment.)    Recommendations for Other Services       Precautions / Restrictions Precautions Precautions: Fall Restrictions Weight Bearing Restrictions: Yes RUE Weight Bearing: Non weight bearing LUE Weight Bearing: Weight bear through elbow only LLE Weight Bearing: Weight bearing as tolerated Other Position/Activity Restrictions: Lumbar fx no brace.    Mobility  Bed Mobility Overal bed mobility: Needs Assistance Bed Mobility: Sit to Supine     Supine to sit: Mod assist Sit to supine: Supervision   General bed mobility comments: Pt able to manage to return himself to bed with gravity assisting to lower his trunk.  Transfers Overall transfer level: Needs assistance Equipment used: None;Left platform walker;Straight  cane Transfers: Sit to/from Stand Sit to Stand: Mod assist;Min guard         General transfer comment: moderated assistance without device and min guard with use of L Platform SPC.  Ambulation/Gait Ambulation/Gait assistance: Supervision Gait Distance (Feet): 40 Feet(with in room.) Assistive device: None Gait Pattern/deviations: Step-through pattern;Decreased step length - right;Decreased step length - left;Shuffle;Antalgic Gait velocity: slowed   General Gait Details: Pt with short stance time on L and decreased stride on R.  He presents with waddling pattern due to pain in L hip.   Stairs Stairs: Yes   Stair Management: No rails;Step to pattern;Forwards Number of Stairs: 4 General stair comments: Cues for sequencing and educated to not use hands on rails for safety to maintain BUE NWB.   Wheelchair Mobility    Modified Rankin (Stroke Patients Only)       Balance Overall balance assessment: Needs assistance   Sitting balance-Leahy Scale: Good       Standing balance-Leahy Scale: Fair                              Cognition Arousal/Alertness: Awake/alert Behavior During Therapy: WFL for tasks assessed/performed Overall Cognitive Status: Within Functional Limits for tasks assessed                                 General Comments: pt eager to move, somewhat impulsive with initial movement probably due to pain      Exercises    General Comments        Pertinent Vitals/Pain Pain  Assessment: 0-10 Pain Score: 6  Faces Pain Scale: Hurts little more Pain Location: grossly overall given multiple injuries. Pain Descriptors / Indicators: Sore Pain Intervention(s): Monitored during session;Repositioned    Home Living                      Prior Function            PT Goals (current goals can now be found in the care plan section) Acute Rehab PT Goals Patient Stated Goal: get back to working PT Goal Formulation: With  patient/family Potential to Achieve Goals: Good Progress towards PT goals: Progressing toward goals    Frequency    7X/week      PT Plan Current plan remains appropriate    Co-evaluation              AM-PAC PT "6 Clicks" Mobility   Outcome Measure  Help needed turning from your back to your side while in a flat bed without using bedrails?: A Little Help needed moving from lying on your back to sitting on the side of a flat bed without using bedrails?: A Lot Help needed moving to and from a bed to a chair (including a wheelchair)?: A Little Help needed standing up from a chair using your arms (e.g., wheelchair or bedside chair)?: A Little Help needed to walk in hospital room?: A Little Help needed climbing 3-5 steps with a railing? : A Little 6 Click Score: 17    End of Session Equipment Utilized During Treatment: Gait belt Activity Tolerance: Patient limited by pain;Patient tolerated treatment well Patient left: in chair;with call bell/phone within reach;with nursing/sitter in room Nurse Communication: Mobility status;Weight bearing status PT Visit Diagnosis: Unsteadiness on feet (R26.81);Other abnormalities of gait and mobility (R26.89);Difficulty in walking, not elsewhere classified (R26.2);Pain Pain - part of body: Leg;Arm(back)     Time: [id number] PT Time Calculation (min) (ACUTE ONLY): 30 min  Charges:  $Gait Training: 8-22 mins $Therapeutic Activity: 8-22 mins                     Robert Hahn, PTA Acute Rehabilitation Services Pager [phone] Office [phone]     Robert Hahn 07/30/2019, 2:52 PM

## 2019-07-30 NOTE — Progress Notes (Signed)
Occupational Therapy Treatment Patient Details Name: Robert Hahn MRN: [id number] DOB: [birth year] Today's Date: 07/30/2019    History of present illness 58 y.o. male who fell 20 feet and sustained multiple orthopedic injuries.  He was found to have right elbow fracture dislocation, left lunate dislocation, displaced vertical left femoral neck fracture and lumbar transverse process fx as well as adrenal hemorrhage.  He underwent emergent closed reduction of the right elbow and left lunate dislocation. s/p L THA anterior approach, and R elbow relocation and L lunate dislocation relocation 9/8    OT comments  Pt able to self feed with set up of tray and use of built up utensils. Ambulated to bathroom, stood to urinate and washed hands with min guard assist. Needs mod assist to rise from low surface. Educated pt in how to raise sitting surfaces at home. Pt hopeful to go home today.  Follow Up Recommendations  Home health OT;Supervision/Assistance - 24 hour    Equipment Recommendations  3 in 1 bedside commode    Recommendations for Other Services      Precautions / Restrictions Precautions Precautions: Fall Restrictions Weight Bearing Restrictions: Yes RUE Weight Bearing: Non weight bearing LUE Weight Bearing: Weight bear through elbow only LLE Weight Bearing: Weight bearing as tolerated       Mobility Bed Mobility               General bed mobility comments: pt in chair, returned to chair  Transfers Overall transfer level: Needs assistance Equipment used: None Transfers: Sit to/from Stand Sit to Stand: Mod assist         General transfer comment: mod to rise and steady    Balance Overall balance assessment: Needs assistance   Sitting balance-Leahy Scale: Good       Standing balance-Leahy Scale: Fair                             ADL either performed or assessed with clinical judgement   ADL Overall ADL's : Needs  assistance/impaired Eating/Feeding: Minimal assistance;Cueing for compensatory techinques Eating/Feeding Details (indicate cue type and reason): issued red foam build ups for utensils and educated in use, pt drinking with extended straw Grooming: Wash/dry hands;Standing;Min Teacher, music: Min guard;Ambulation Toilet Transfer Details (indicate cue type and reason): stood to urinate Toileting- Water quality scientist and Hygiene: Min guard       Functional mobility during ADLs: Min guard       Vision       Perception     Praxis      Cognition Arousal/Alertness: Awake/alert Behavior During Therapy: WFL for tasks assessed/performed Overall Cognitive Status: Within Functional Limits for tasks assessed                                          Exercises     Shoulder Instructions       General Comments      Pertinent Vitals/ Pain       Pain Assessment: Faces Faces Pain Scale: Hurts little more Pain Location: back Pain Descriptors / Indicators: Sore Pain Intervention(s): Repositioned  Home Living  Prior Functioning/Environment              Frequency  Min 2X/week        Progress Toward Goals  OT Goals(current goals can now be found in the care plan section)  Progress towards OT goals: Progressing toward goals  Acute Rehab OT Goals Patient Stated Goal: get back to working OT Goal Formulation: With patient/family Time For Goal Achievement: 08/12/19 Potential to Achieve Goals: Good  Plan Discharge plan remains appropriate    Co-evaluation                 AM-PAC OT "6 Clicks" Daily Activity     Outcome Measure   Help from another person eating meals?: A Little Help from another person taking care of personal grooming?: A Little Help from another person toileting, which includes using toliet, bedpan, or urinal?: A Little Help from another  person bathing (including washing, rinsing, drying)?: Total Help from another person to put on and taking off regular upper body clothing?: A Lot Help from another person to put on and taking off regular lower body clothing?: Total 6 Click Score: 13    End of Session Equipment Utilized During Treatment: Gait belt  OT Visit Diagnosis: History of falling (Z91.81);Muscle weakness (generalized) (M62.81);Pain   Activity Tolerance Patient tolerated treatment well   Patient Left in chair;with call bell/phone within reach;with family/visitor present   Nurse Communication          Time: [id number] OT Time Calculation (min): 38 min  Charges: OT General Charges $OT Visit: 1 Visit OT Treatments $Self Care/Home Management : 23-37 mins  Martie RoundJulie Horst Ostermiller, OTR/L Acute Rehabilitation Services Pager: [phone] Office: [phone]   Evern BioMayberry, Cecile Gillispie Lynn 07/30/2019, 12:38 PM

## 2019-07-30 NOTE — Discharge Summary (Signed)
Patient ID: Robert Hahn [id number] 1961/03/15 58 y.o.  Admit date: 07/26/2019 Discharge date: 07/30/2019  Admitting Diagnosis: fall, 20 feet Right elbow dislocation Left wrist dislocation Lumbar transverse process fractures Right hip fracture Adrenal hemorrhage  Discharge Diagnosis Patient Active Problem List   Diagnosis Date Noted  . Closed fracture dislocation of right elbow joint, initial encounter 07/27/2019  . Left displaced femoral neck fracture ([place]) 07/27/2019  . Closed dislocation of perilunate joint, left, initial encounter 07/27/2019  . Adrenal hemorrhage ([place]) 07/27/2019  . Lumbar transverse process fracture, closed, initial encounter ([place]) 07/27/2019  . Fall 07/26/2019    Consultants Dr. Rod Can, ortho Dr. Roseanne Kaufman, ortho hand Dr. Arnoldo Morale, neurosurgery Dr. Jeffie Pollock, urology  Reason for Admission: Patient is a 58 year old male with no past medical history, who comes in after falling from a ladder, 20 feet.  According to the patient's daughter he was cutting a limb when the limb fell and hit the ladder.  Patient landed on his hands.  Patient denies any LOC.  Patient was coded as a level 2 trauma on intake. Patient underwent ATLS work-up.  Patient was found to have right elbow dislocation, left wrist dislocation, lumbar fractures, adrenal hemorrhage, and right hip fracture.  Trauma surgery was consulted for admission.  Orthopedic surgery was consulted for fractures dislocations.  Neurosurgery was consulted for lumbar fractures.  Urology was consulted for adrenal hemorrhage.  Procedures Dr. Lyla Glassing, 07/27/09 Left total hip arthroplasty, anterior approach.   Dr. Roseanne Kaufman, 07/27/09 #1 open relocation right elbow dislocation #2 ulnar collateral ligament repair direct primary and with internal bridge/brace utilizing labral tape with Arthrex materials/push lock #3 open treatment distal humerus/medial epicondylar fracture #4 open treatment  olecranon fracture/coronoid fracture with primary excision of the coronoid fragment #5 ulnar nerve decompression elbow in situ #6 medial antebrachial cutaneous nerve neuroplasty #7 left wrist carpal tunnel release #8 left wrist EPL tendon transfer #9 posterior interosseous nerve neurectomy left wrist #10 open treatment distal radius fracture left wrist #11 open treatment complex wrist instability secondary to lunate dislocation with direct ligamentous repair, capsulorrhaphy and pinning of the radiocarpal and midcarpal joint.  We utilized a tendon graft and 3.5 Arthrex swivel locks for a scapholunate reconstruction.  #12 radiographic series 6 views right elbow and 6 views left wrist #13 Kirschner wire fixation of the scaphocapitate and lunotriquetral joints with repair of the volar floor of the capsule left wrist   Hospital Course:  The patient was admitted and NS and urology evaluated the patient secondary to his TVP FXs as well as his adrenal hemorrhage.  No intervention was required for either except pain control and monitoring H/H which was stable.  He was noted to have multiple extremity fractures including his left hip, right elbow, and left wrist.  He was seen by Dr. Lyla Glassing for his hip and ultimately underwent a total hip arthroplasty for this.  Dr. Amedeo Plenty performed the above procedures for his right elbow and left wrist.  These two extremities are in splints.  PT/OT evaluated after surgery.  HH PT/OT and 24 supervision was recommended.  This was set up along with a hospital bed and some equipment.  He was otherwise stable for DC home on POD 2.  He was eating well and pain was well controlled.  Physical Exam: See progress note from earlier today.  Allergies as of 07/30/2019   No Known Allergies     Medication List    TAKE these medications   acetaminophen 325 MG tablet  Commonly known as: TYLENOL Take 2 tablets (650 mg total) by mouth every 6 (six) hours.   enoxaparin 40 MG/0.4ML  injection Commonly known as: LOVENOX Inject 0.4 mLs (40 mg total) into the skin daily for 14 days. After lovenox is completed, begin ASA 81 mg chewable PO BID for 4 additional weeks.   gabapentin 100 MG capsule Commonly known as: NEURONTIN Take 1 capsule (100 mg total) by mouth 3 (three) times daily.   loratadine 10 MG tablet Commonly known as: CLARITIN Take 5 mg by mouth daily.   methocarbamol 500 MG tablet Commonly known as: ROBAXIN Take 1 tablet (500 mg total) by mouth 3 (three) times daily.   oxyCODONE 5 MG immediate release tablet Commonly known as: Oxy IR/ROXICODONE Take 1 tablet (5 mg total) by mouth every 4 (four) hours as needed.            Durable Medical Equipment  (From admission, onward)         Start     Ordered   07/30/19 1134  For home use only DME Bedside commode  Once    Question:  Patient needs a bedside commode to treat with the following condition  Answer:  Physical deconditioning   07/30/19 1134   07/30/19 0829  For home use only DME Hospital bed  Once    Question Answer Comment  Length of Need 6 Months   The above medical condition requires: Patient requires the ability to reposition frequently   Bed type Semi-electric      07/30/19 0828           Follow-up Information    [facility] Follow up.   Why: Hospital bed, bedside commode Contact information: [address] [place] [address] [phone]        [facility] Follow up.   Specialty: Home Health Services Why: Home Health Physical and Occupational Therapy Contact information: [address] [place] [address] [phone]        Samson FredericSwinteck, [name], MD. Schedule an appointment as soon as possible for a visit in 2 week(s).   Specialty: Orthopedic Surgery Contact information: [address] [place] [address] [phone]        Dominica SeverinGramig, [name], MD. Schedule an appointment as soon as possible for a visit in 2 week(s).    Specialty: Orthopedic Surgery Contact information: [address] [place] [address] [phone]        Ignacia PalmaBeck, Mark C., MD Follow up in 2 week(s).   Specialty: Family Medicine Why: follow up as needed Contact information: [address]  [address] [place] [address] [phone]           Signed: Barnetta ChapelKelly Kharlie Bring, [facility] 07/30/2019, 1:20 PM Pager: [phone]

## 2019-07-30 NOTE — Progress Notes (Signed)
Discharge instructions given to patient and wife. All questions answered. Went over with wife how to do Lovenox injections and she verbalized understanding and given kit. All belongings gathered to be sent home. Pt's platform cane delivered to room.

## 2019-08-02 NOTE — Anesthesia Postprocedure Evaluation (Signed)
Anesthesia Post Note  Patient: Kullen Tomasetti  Procedure(s) Performed: TOTAL HIP ARTHROPLASTY ANTERIOR APPROACH (Left Hip) OPEN REDUCTION INTERNAL FIXATION (ORIF) ELBOW/OLECRANON FRACTURE AND LIGAMENT RECONSTRUCTION (Right Elbow) OPEN REDUCTION INTERNAL FIXATION (ORIF) WRIST FRACTURE (Left Wrist) Carpal Tunnel Release (Left Hand)     Patient location during evaluation: PACU Anesthesia Type: General Level of consciousness: awake and alert Pain management: pain level controlled Vital Signs Assessment: post-procedure vital signs reviewed and stable Respiratory status: spontaneous breathing, nonlabored ventilation, respiratory function stable and patient connected to nasal cannula oxygen Cardiovascular status: blood pressure returned to baseline and stable Postop Assessment: no apparent nausea or vomiting Anesthetic complications: no    Last Vitals:  Vitals:   07/30/19 0744 07/30/19 1526  BP: (!) 167/78 (!) 191/85  Pulse: 85 (!) 104  Resp: 15 16  Temp: 36.9 C 37.1 C  SpO2: 97% 93%    Last Pain:  Vitals:   07/30/19 1835  TempSrc:   PainSc: 5                  Macee Venables

## 2019-08-24 ENCOUNTER — Encounter (HOSPITAL_COMMUNITY): Payer: Self-pay | Admitting: Orthopedic Surgery

## 2019-09-21 ENCOUNTER — Other Ambulatory Visit: Payer: Self-pay

## 2019-09-21 ENCOUNTER — Other Ambulatory Visit (HOSPITAL_COMMUNITY)
Admission: RE | Admit: 2019-09-21 | Discharge: 2019-09-21 | Disposition: A | Discharge status: Home / Self Care | Payer: BC Managed Care – PPO | Source: Ambulatory Visit | Attending: Orthopedic Surgery | Admitting: Orthopedic Surgery

## 2019-09-21 ENCOUNTER — Encounter (HOSPITAL_COMMUNITY): Payer: Self-pay | Admitting: *Deleted

## 2019-09-21 DIAGNOSIS — Z01812 Encounter for preprocedural laboratory examination: Secondary | ICD-10-CM | POA: Insufficient documentation

## 2019-09-21 DIAGNOSIS — Z20828 Contact with and (suspected) exposure to other viral communicable diseases: Secondary | ICD-10-CM | POA: Insufficient documentation

## 2019-09-21 LAB — SARS CORONAVIRUS 2 (TAT 6-24 HRS): SARS Coronavirus 2: NEGATIVE

## 2019-09-21 NOTE — Progress Notes (Signed)
Patient denies shortness of breath, fever, cough and chest pain.  PCP - Dr Glendale Chard Cardiologist - denies  Chest x-ray - 07/28/19 1 view EKG - denies Stress Test - denies ECHO - denies Cardiac Cath - denies  Aspirin Instructions: Follow your surgeon's instructions on when to stop aspirin prior to surgery,  If no instructions were given by your surgeon then you will need to call the office for those instructions.  Anesthesia review: No  STOP now taking any Aspirin (unless otherwise instructed by your surgeon), Aleve, Naproxen, Ibuprofen, Motrin, Advil, Goody's, BC's, all herbal medications, fish oil, and all vitamins.   Coronavirus Screening Have you experienced the following symptoms:  Cough yes/no: No Fever (>100.79F)  yes/no: No Runny nose yes/no: No Sore throat yes/no: No Difficulty breathing/shortness of breath  yes/no: No  Have you traveled in the last 14 days and where? yes/no: No  Patient verbalized understanding of instructions that were given via phone.

## 2019-09-22 ENCOUNTER — Ambulatory Visit (HOSPITAL_COMMUNITY): Payer: BC Managed Care – PPO | Admitting: Anesthesiology

## 2019-09-22 ENCOUNTER — Encounter (HOSPITAL_COMMUNITY): Payer: Self-pay | Admitting: Orthopedic Surgery

## 2019-09-22 ENCOUNTER — Encounter (HOSPITAL_COMMUNITY)
Admission: RE | Disposition: A | Discharge status: Home / Self Care | Payer: Self-pay | Source: Ambulatory Visit | Attending: Orthopedic Surgery

## 2019-09-22 ENCOUNTER — Ambulatory Visit (HOSPITAL_COMMUNITY)
Admission: RE | Admit: 2019-09-22 | Discharge: 2019-09-22 | Disposition: A | Discharge status: Home / Self Care | Payer: BC Managed Care – PPO | Source: Ambulatory Visit | Attending: Orthopedic Surgery | Admitting: Orthopedic Surgery

## 2019-09-22 DIAGNOSIS — Z79899 Other long term (current) drug therapy: Secondary | ICD-10-CM | POA: Insufficient documentation

## 2019-09-22 DIAGNOSIS — M24431 Recurrent dislocation, right wrist: Secondary | ICD-10-CM | POA: Diagnosis not present

## 2019-09-22 DIAGNOSIS — Z96631 Presence of right artificial wrist joint: Secondary | ICD-10-CM | POA: Diagnosis not present

## 2019-09-22 DIAGNOSIS — Z7982 Long term (current) use of aspirin: Secondary | ICD-10-CM | POA: Insufficient documentation

## 2019-09-22 HISTORY — PX: WRIST FUSION WITH ILIAC CREST BONE GRAFT: SHX5682

## 2019-09-22 HISTORY — DX: Pneumonia, unspecified organism: J18.9

## 2019-09-22 LAB — POCT I-STAT, CHEM 8
BUN: 17 mg/dL (ref 6–20)
Calcium, Ion: 1.23 mmol/L (ref 1.15–1.40)
Chloride: 104 mmol/L (ref 98–111)
Creatinine, Ser: 0.9 mg/dL (ref 0.61–1.24)
Glucose, Bld: 96 mg/dL (ref 70–99)
HCT: 46 % (ref 39.0–52.0)
Hemoglobin: 15.6 g/dL (ref 13.0–17.0)
Potassium: 4 mmol/L (ref 3.5–5.1)
Sodium: 139 mmol/L (ref 135–145)
TCO2: 24 mmol/L (ref 22–32)

## 2019-09-22 SURGERY — WRIST FUSION WITH ILIAC CREST BONE GRAFT
Anesthesia: General | Site: Arm Lower | Laterality: Bilateral

## 2019-09-22 MED ORDER — FENTANYL CITRATE (PF) 100 MCG/2ML IJ SOLN
INTRAMUSCULAR | Status: AC
  Administered 2019-09-22: 100 ug via INTRAVENOUS
  Filled 2019-09-22: qty 2

## 2019-09-22 MED ORDER — CHLORHEXIDINE GLUCONATE 4 % EX LIQD: 60.0000 mL | Freq: Once | CUTANEOUS | Status: DC

## 2019-09-22 MED ORDER — FENTANYL CITRATE (PF) 100 MCG/2ML IJ SOLN
100.0000 ug | Freq: Once | INTRAMUSCULAR | Status: AC
  Administered 2019-09-22: 15:00:00 100 ug via INTRAVENOUS

## 2019-09-22 MED ORDER — PROPOFOL 10 MG/ML IV BOLUS
INTRAVENOUS | Status: DC | PRN
  Administered 2019-09-22: 200 mg via INTRAVENOUS

## 2019-09-22 MED ORDER — CEFAZOLIN SODIUM-DEXTROSE 2-4 GM/100ML-% IV SOLN
INTRAVENOUS | Status: AC
  Filled 2019-09-22: qty 100

## 2019-09-22 MED ORDER — PHENYLEPHRINE 40 MCG/ML (10ML) SYRINGE FOR IV PUSH (FOR BLOOD PRESSURE SUPPORT)
PREFILLED_SYRINGE | INTRAVENOUS | Status: DC | PRN
  Administered 2019-09-22 (×3): 80 ug via INTRAVENOUS

## 2019-09-22 MED ORDER — BUPIVACAINE HCL (PF) 0.25 % IJ SOLN
INTRAMUSCULAR | Status: AC
  Filled 2019-09-22: qty 20

## 2019-09-22 MED ORDER — LACTATED RINGERS IV SOLN
INTRAVENOUS | Status: DC
  Administered 2019-09-22 (×3): via INTRAVENOUS

## 2019-09-22 MED ORDER — ONDANSETRON HCL 4 MG/2ML IJ SOLN
INTRAMUSCULAR | Status: AC
  Filled 2019-09-22: qty 2

## 2019-09-22 MED ORDER — OXYCODONE HCL 5 MG/5ML PO SOLN: 5.0000 mg | Freq: Once | ORAL | Status: DC | PRN

## 2019-09-22 MED ORDER — ONDANSETRON HCL 4 MG/2ML IJ SOLN
INTRAMUSCULAR | Status: DC | PRN
  Administered 2019-09-22: 4 mg via INTRAVENOUS

## 2019-09-22 MED ORDER — DEXAMETHASONE SODIUM PHOSPHATE 10 MG/ML IJ SOLN
INTRAMUSCULAR | Status: AC
  Filled 2019-09-22: qty 1

## 2019-09-22 MED ORDER — DEXAMETHASONE SODIUM PHOSPHATE 4 MG/ML IJ SOLN
INTRAMUSCULAR | Status: DC | PRN
  Administered 2019-09-22: 5 mg via INTRAVENOUS

## 2019-09-22 MED ORDER — BUPIVACAINE HCL (PF) 0.25 % IJ SOLN
INTRAMUSCULAR | Status: DC | PRN
  Administered 2019-09-22: 4 mL

## 2019-09-22 MED ORDER — FENTANYL CITRATE (PF) 100 MCG/2ML IJ SOLN: 25.0000 ug | INTRAMUSCULAR | Status: DC | PRN

## 2019-09-22 MED ORDER — OXYCODONE HCL 5 MG PO TABS: 5.0000 mg | ORAL_TABLET | Freq: Once | ORAL | Status: DC | PRN

## 2019-09-22 MED ORDER — LIDOCAINE 2% (20 MG/ML) 5 ML SYRINGE
INTRAMUSCULAR | Status: AC
  Filled 2019-09-22: qty 5

## 2019-09-22 MED ORDER — PHENYLEPHRINE 40 MCG/ML (10ML) SYRINGE FOR IV PUSH (FOR BLOOD PRESSURE SUPPORT)
PREFILLED_SYRINGE | INTRAVENOUS | Status: AC
  Filled 2019-09-22: qty 10

## 2019-09-22 MED ORDER — MIDAZOLAM HCL 5 MG/5ML IJ SOLN
INTRAMUSCULAR | Status: DC | PRN
  Administered 2019-09-22: 2 mg via INTRAVENOUS

## 2019-09-22 MED ORDER — MIDAZOLAM HCL 2 MG/2ML IJ SOLN
INTRAMUSCULAR | Status: AC
  Administered 2019-09-22: 15:00:00 2 mg via INTRAVENOUS
  Filled 2019-09-22: qty 2

## 2019-09-22 MED ORDER — CEFAZOLIN SODIUM-DEXTROSE 2-4 GM/100ML-% IV SOLN
2.0000 g | INTRAVENOUS | Status: AC
  Administered 2019-09-22: 2 g via INTRAVENOUS

## 2019-09-22 MED ORDER — LIDOCAINE 2% (20 MG/ML) 5 ML SYRINGE
INTRAMUSCULAR | Status: DC | PRN
  Administered 2019-09-22: 100 mg via INTRAVENOUS

## 2019-09-22 MED ORDER — MIDAZOLAM HCL 2 MG/2ML IJ SOLN
2.0000 mg | Freq: Once | INTRAMUSCULAR | Status: AC
  Administered 2019-09-22: 15:00:00 2 mg via INTRAVENOUS

## 2019-09-22 MED ORDER — ONDANSETRON HCL 4 MG/2ML IJ SOLN: 4.0000 mg | Freq: Once | INTRAMUSCULAR | Status: DC | PRN

## 2019-09-22 MED ORDER — FENTANYL CITRATE (PF) 250 MCG/5ML IJ SOLN
INTRAMUSCULAR | Status: AC
  Filled 2019-09-22: qty 5

## 2019-09-22 MED ORDER — FENTANYL CITRATE (PF) 100 MCG/2ML IJ SOLN
INTRAMUSCULAR | Status: DC | PRN
  Administered 2019-09-22 (×2): 25 ug via INTRAVENOUS
  Administered 2019-09-22: 50 ug via INTRAVENOUS
  Administered 2019-09-22: 25 ug via INTRAVENOUS

## 2019-09-22 MED ORDER — 0.9 % SODIUM CHLORIDE (POUR BTL) OPTIME
TOPICAL | Status: DC | PRN
  Administered 2019-09-22: 1000 mL

## 2019-09-22 MED ORDER — MIDAZOLAM HCL 2 MG/2ML IJ SOLN
INTRAMUSCULAR | Status: AC
  Filled 2019-09-22: qty 2

## 2019-09-22 SURGICAL SUPPLY — 57 items
BLADE CLIPPER SURG (BLADE) IMPLANT
BNDG ELASTIC 3X5.8 VLCR STR LF (GAUZE/BANDAGES/DRESSINGS) ×3 IMPLANT
BNDG ELASTIC 4X5.8 VLCR STR LF (GAUZE/BANDAGES/DRESSINGS) ×3 IMPLANT
BNDG ESMARK 4X9 LF (GAUZE/BANDAGES/DRESSINGS) ×3 IMPLANT
BNDG GAUZE ELAST 4 BULKY (GAUZE/BANDAGES/DRESSINGS) ×9 IMPLANT
CORD BIPOLAR FORCEPS 12FT (ELECTRODE) ×3 IMPLANT
COVER SURGICAL LIGHT HANDLE (MISCELLANEOUS) ×3 IMPLANT
COVER WAND RF STERILE (DRAPES) ×3 IMPLANT
CUFF TOURN SGL QUICK 18X4 (TOURNIQUET CUFF) ×3 IMPLANT
CUFF TOURN SGL QUICK 24 (TOURNIQUET CUFF)
CUFF TRNQT CYL 24X4X16.5-23 (TOURNIQUET CUFF) IMPLANT
DECANTER SPIKE VIAL GLASS SM (MISCELLANEOUS) IMPLANT
DRAIN TLS ROUND 10FR (DRAIN) IMPLANT
DRAPE INCISE IOBAN 66X45 STRL (DRAPES) IMPLANT
DRAPE OEC MINIVIEW 54X84 (DRAPES) IMPLANT
DRAPE U-SHAPE 47X51 STRL (DRAPES) ×3 IMPLANT
DRSG ADAPTIC 3X8 NADH LF (GAUZE/BANDAGES/DRESSINGS) ×3 IMPLANT
ELECT REM PT RETURN 9FT ADLT (ELECTROSURGICAL)
ELECTRODE REM PT RTRN 9FT ADLT (ELECTROSURGICAL) IMPLANT
GAUZE SPONGE 4X4 12PLY STRL (GAUZE/BANDAGES/DRESSINGS) ×3 IMPLANT
GAUZE XEROFORM 1X8 LF (GAUZE/BANDAGES/DRESSINGS) ×3 IMPLANT
GAUZE XEROFORM 5X9 LF (GAUZE/BANDAGES/DRESSINGS) ×3 IMPLANT
GLOVE BIOGEL M 8.0 STRL (GLOVE) ×3 IMPLANT
GLOVE SS BIOGEL STRL SZ 8 (GLOVE) ×1 IMPLANT
GLOVE SUPERSENSE BIOGEL SZ 8 (GLOVE) ×2
GOWN STRL REUS W/ TWL LRG LVL3 (GOWN DISPOSABLE) ×3 IMPLANT
GOWN STRL REUS W/ TWL XL LVL3 (GOWN DISPOSABLE) ×3 IMPLANT
GOWN STRL REUS W/TWL LRG LVL3 (GOWN DISPOSABLE) ×6
GOWN STRL REUS W/TWL XL LVL3 (GOWN DISPOSABLE) ×6
KIT BASIN OR (CUSTOM PROCEDURE TRAY) ×3 IMPLANT
KIT TURNOVER KIT B (KITS) ×3 IMPLANT
LOOP VESSEL MAXI BLUE (MISCELLANEOUS) ×3 IMPLANT
MANIFOLD NEPTUNE II (INSTRUMENTS) ×3 IMPLANT
NEEDLE 22X1 1/2 (OR ONLY) (NEEDLE) IMPLANT
NEEDLE BLUNT 16X1.5 OR ONLY (NEEDLE) IMPLANT
NS IRRIG 1000ML POUR BTL (IV SOLUTION) ×3 IMPLANT
PACK ORTHO EXTREMITY (CUSTOM PROCEDURE TRAY) ×3 IMPLANT
PAD ARMBOARD 7.5X6 YLW CONV (MISCELLANEOUS) ×6 IMPLANT
PAD CAST 4YDX4 CTTN HI CHSV (CAST SUPPLIES) ×3 IMPLANT
PADDING CAST COTTON 4X4 STRL (CAST SUPPLIES) ×6
SOL PREP POV-IOD 4OZ 10% (MISCELLANEOUS) ×6 IMPLANT
SPONGE LAP 4X18 RFD (DISPOSABLE) ×3 IMPLANT
STAPLER VISISTAT 35W (STAPLE) IMPLANT
SUCTION FRAZIER HANDLE 10FR (MISCELLANEOUS) ×2
SUCTION TUBE FRAZIER 10FR DISP (MISCELLANEOUS) ×1 IMPLANT
SUT ETHILON 4 0 PS 2 18 (SUTURE) IMPLANT
SUT PROLENE 4 0 PS 2 18 (SUTURE) IMPLANT
SUT VIC AB 3-0 FS2 27 (SUTURE) IMPLANT
SYR CONTROL 10ML LL (SYRINGE) IMPLANT
SYSTEM CHEST DRAIN TLS 7FR (DRAIN) IMPLANT
TOWEL GREEN STERILE (TOWEL DISPOSABLE) ×3 IMPLANT
TOWEL GREEN STERILE FF (TOWEL DISPOSABLE) ×3 IMPLANT
TUBE CONNECTING 12'X1/4 (SUCTIONS) ×1
TUBE CONNECTING 12X1/4 (SUCTIONS) ×2 IMPLANT
TUBE EVACUATION TLS (MISCELLANEOUS) ×3 IMPLANT
WATER STERILE IRR 1000ML POUR (IV SOLUTION) ×3 IMPLANT
YANKAUER SUCT BULB TIP NO VENT (SUCTIONS) IMPLANT

## 2019-09-22 NOTE — H&P (Signed)
Robert Hahn is an 58 y.o. male.   Chief Complaint: Retained hardware left wrist.  Chronic lunate dislocation right wrist.  History of elbow reconstruction right upper extremity and left wrist reconstruction HPI: Patient presents for surgical removal of deep hardware about the left wrist status post scapholunate reconstruction.  The patient understands risk and benefits.  The patient also has a lunate dislocation on the right side chronic in nature but not overly symptomatic.  I discussed with the patient that given the injury process on the right side I would recommend proximal carpectomy and repair reconstruction is necessary.  With this in mind he desires to proceed.  Patient presents for evaluation and treatment of the of their upper extremity predicament. The patient denies neck, back, chest or  abdominal pain. The patient notes that they have no lower extremity problems. The patients primary complaint is noted. We are planning surgical care pathway for the upper extremity.  Past Medical History:  Diagnosis Date  . Medical history non-contributory   . Pneumonia    x 1 yrs ago    Past Surgical History:  Procedure Laterality Date  . CARPAL TUNNEL RELEASE Left 07/28/2019   Procedure: Carpal Tunnel Release;  Surgeon: Roseanne Kaufman, MD;  Location: [facility];  Service: Orthopedics;  Laterality: Left;  . COLONOSCOPY    . ORIF ELBOW FRACTURE Right 07/28/2019   Procedure: OPEN REDUCTION INTERNAL FIXATION (ORIF) ELBOW/OLECRANON FRACTURE AND LIGAMENT RECONSTRUCTION;  Surgeon: Roseanne Kaufman, MD;  Location: [facility];  Service: Orthopedics;  Laterality: Right;  . ORIF WRIST FRACTURE Left 07/28/2019   Procedure: OPEN REDUCTION INTERNAL FIXATION (ORIF) WRIST FRACTURE;  Surgeon: Roseanne Kaufman, MD;  Location: [facility];  Service: Orthopedics;  Laterality: Left;  . TOTAL HIP ARTHROPLASTY Left 07/28/2019   Procedure: TOTAL HIP ARTHROPLASTY ANTERIOR APPROACH;  Surgeon: Rod Can, MD;  Location: [facility];   Service: Orthopedics;  Laterality: Left;    History reviewed. No pertinent family history. Social History:  reports that he has never smoked. He has never used smokeless tobacco. He reports previous alcohol use of about 4.0 standard drinks of alcohol per week. He reports that he does not use drugs.  Allergies: No Known Allergies  Medications Prior to Admission  Medication Sig Dispense Refill  . aspirin 325 MG tablet Take 81 mg by mouth daily. Split 4 ways; take 1/4 of a tab every morning    . Coenzyme Q10 (COQ10) 200 MG CAPS Take 200 mg by mouth daily. With Vit d 3 2000    . loratadine (CLARITIN) 10 MG tablet Take 5 mg by mouth daily.    . Melatonin 10 MG TABS Take 10 mg by mouth at bedtime as needed (sleep).    Marland Kitchen oxyCODONE (OXY IR/ROXICODONE) 5 MG immediate release tablet Take 5 mg by mouth 3 (three) times daily as needed for moderate pain or severe pain.     Marland Kitchen Resveratrol 100 MG CAPS Take 100 mg by mouth daily.    . sodium chloride (OCEAN) 0.65 % SOLN nasal spray Place 1 spray into both nostrils as needed for congestion.    . gabapentin (NEURONTIN) 100 MG capsule Take 1 capsule (100 mg total) by mouth 3 (three) times daily. (Patient not taking: Reported on 09/21/2019) 30 capsule 0  . methocarbamol (ROBAXIN) 500 MG tablet Take 1 tablet (500 mg total) by mouth 3 (three) times daily. (Patient not taking: Reported on 09/21/2019) 30 tablet 0    Results for orders placed or performed during the hospital encounter of 09/22/19 (  from the past 48 hour(s))  I-STAT, chem 8     Status: None   Collection Time: 09/22/19  1:47 PM  Result Value Ref Range   Sodium 139 135 - 145 mmol/L   Potassium 4.0 3.5 - 5.1 mmol/L   Chloride 104 98 - 111 mmol/L   BUN 17 6 - 20 mg/dL   Creatinine, Ser 9.37 0.61 - 1.24 mg/dL   Glucose, Bld 96 70 - 99 mg/dL   Calcium, Ion 1.69 6.78 - 1.40 mmol/L   TCO2 24 22 - 32 mmol/L   Hemoglobin 15.6 13.0 - 17.0 g/dL   HCT 93.8 10.1 - 75.1 %   No results found.  Review of  Systems  Respiratory: Negative.   Cardiovascular: Negative.     Blood pressure 138/76, pulse 79, temperature 98.1 F (36.7 C), temperature source Oral, resp. rate (!) 22, height 5\' 10"  (1.778 m), weight 80.3 kg, SpO2 97 %. Physical Exam  Patient has a well positioned and reconstructed right elbow status post traumatic unstable fracture dislocation.  Patient's left wrist is stable status post surgical reconstruction and stabilization with retained hardware which we will removed today.  The patient also has a lunate dislocation right wrist -this is subacute in its presentation.  He had no pain or problems of the wrist but ultimately with a survey I performed in my office found this abnormality which I feel was sustained at the time of his injury.  It is actually fortuitous as it allowed for elbow rehab to the point where he is doing beautifully in the elbow and now he can address the wrist.  As it has been minimally symptomatic I feel that this was simply fortunate but feel that the injury process and the lunate dislocation was a result of his traumatic fall recently.  I discussed him that leaving it as this will only jeopardize an cause problems and thus I would recommend surgical reconstruction.  He understands this.  At present juncture we will plan for the reconstructive efforts.  Chest is clear.  Heart regular rate.  Abdomen is nontender.  Neck and back are stable.  He has had a prior hip reconstruction by my partner which is doing reasonably well. Assessment/Plan We will plan for deep hardware removal left wrist with evaluation under anesthesia and fluoroscopy.  We will also plan for right wrist reconstruction with proximal row carpectomy versus 4 corner fusion and direct repair process.  I discussed with the patient all issues plans and concerns.  We are planning surgery for your upper extremity. The risk and benefits of surgery to include risk of bleeding, infection, anesthesia,  damage to  normal structures and failure of the surgery to accomplish its intended goals of relieving symptoms and restoring function have been discussed in detail. With this in mind we plan to proceed. I have specifically discussed with the patient the pre-and postoperative regime and the dos and don'ts and risk and benefits in great detail. Risk and benefits of surgery also include risk of dystrophy(CRPS), chronic nerve pain, failure of the healing process to go onto completion and other inherent risks of surgery The relavent the pathophysiology of the disease/injury process, as well as the alternatives for treatment and postoperative course of action has been discussed in great detail with the patient who desires to proceed.  We will do everything in our power to help you (the patient) restore function to the upper extremity. It is a pleasure to see this patient today.    Vladimir CroftsM Cincere Deprey III, MD 09/22/2019, 4:06 PM

## 2019-09-22 NOTE — Anesthesia Procedure Notes (Signed)
Procedure Name: LMA Insertion Date/Time: 09/22/2019 4:23 PM Performed by: Alain Marion, CRNA Pre-anesthesia Checklist: Patient identified, Emergency Drugs available, Suction available and Patient being monitored Patient Re-evaluated:Patient Re-evaluated prior to induction Oxygen Delivery Method: Circle System Utilized Preoxygenation: Pre-oxygenation with 100% oxygen Induction Type: IV induction Ventilation: Mask ventilation without difficulty LMA: LMA inserted LMA Size: 4.0 Number of attempts: 1 Airway Equipment and Method: Bite block Placement Confirmation: positive ETCO2 Tube secured with: Tape Dental Injury: Teeth and Oropharynx as per pre-operative assessment

## 2019-09-22 NOTE — Anesthesia Preprocedure Evaluation (Signed)
Anesthesia Evaluation  Patient identified by MRN, date of birth, ID band Patient awake    Reviewed: Allergy & Precautions, NPO status , Patient's Chart, lab work & pertinent test results  Airway Mallampati: II  TM Distance: >3 FB Neck ROM: Full    Dental  (+) Teeth Intact, Dental Advisory Given   Pulmonary    breath sounds clear to auscultation       Cardiovascular  Rhythm:Regular Rate:Normal     Neuro/Psych    GI/Hepatic   Endo/Other    Renal/GU      Musculoskeletal   Abdominal   Peds  Hematology   Anesthesia Other Findings   Reproductive/Obstetrics                             Anesthesia Physical Anesthesia Plan  ASA: II  Anesthesia Plan: General   Post-op Pain Management:  Regional for Post-op pain   Induction: Intravenous  PONV Risk Score and Plan: Ondansetron and Dexamethasone  Airway Management Planned: LMA  Additional Equipment:   Intra-op Plan:   Post-operative Plan:   Informed Consent: I have reviewed the patients History and Physical, chart, labs and discussed the procedure including the risks, benefits and alternatives for the proposed anesthesia with the patient or authorized representative who has indicated his/her understanding and acceptance.     Dental advisory given  Plan Discussed with: CRNA and Anesthesiologist  Anesthesia Plan Comments:         Anesthesia Quick Evaluation  

## 2019-09-22 NOTE — Op Note (Signed)
Operative note September 22, 2019  Roseanne Kaufman MD  Preoperative diagnosis #1 chronic lunate dislocation/subacute dislocation right wrist #2 retained hardware left wrist  Postop diagnosis the same  Operative procedure #1 right wrist posterior interosseous nerve neurectomy #2 EPL decompression and transposition right wrist #3 AP lateral and oblique x-rays right wrist #4 proximal row carpectomy right wrist #5 open extensile carpal tunnel release right wrist #6 capsular reconstruction volar floor right wrist through a separate volar approach including capsulodesis and direct volar ligament repair #7 excision deep hardware/deep hardware removal left wrist #8 4 view radiographic series left wrist  Surgeon Roseanne Kaufman  Anesthesia General with preoperative right arm block  Estimated blood loss minimal  Tourniquet time less than 90 minutes on the right side and 0 on the left  Indications for the procedure this patient is a very pleasant 58 year old male with multiple injuries including a prior right elbow fracture dislocation and left wrist lunate dislocation with significant and striking abnormality.  These injuries are status post reconstructive efforts and doing reasonably well.  He has also undergone a hip replacement due to the injury processes.  The patient has currently a right lunate dislocation and this was noted on a exam in my office.  Despite minimal to no pain he has a lunate dislocation which I feel was due to the injury process.  I discussed with him the above-mentioned surgical intervention he desires to proceed.  Fortunately he does not have advanced carpal tunnel syndrome symptoms since the injury on the right side.  Nevertheless he has significant injury to the volar floor and a chronic lunate dislocation.  Operative procedure patient was seen by myself and anesthesia taken to the operative theater and underwent a smooth induction of general anesthesia.  Preoperative right arm  block was placed.  Following this I performed a Hibiclens prescrub followed by 10-minute surgical Betadine scrub and paint.  An incision was then made dorsally after timeout was called and antibiotics were given.  Incision was carried down the patient had the extensor retinaculum Z lengthened.  I then released the extensor pollicis longus tendon and performed a EPL decompression and tendon transposition of the dorsal soft tissues.  Following this I then performed posterior interosseous nerve neurectomy with crushing and cauterization technique and resection of a 3 cm segment.  Following this I performed an H shaped incision in the wrist dissected down and removed the scaphoid followed by the triquetrum followed by mobilization of scar tissue and removal of the lunate which was a displaced in a palmar direction of course.  This was in the carpal canal region and the floor was blown out.  I retrieve the lunate excised it and this completed the proximal row carpectomy.  I then performed a complex capsular reconstruction dorsally.  This included capsulorrhaphy.  The lunate fossa about the radius housed the capitate nicely.  At this juncture I repaired the retinaculum of the fourth dorsal compartment in a Z-lengthening fashion and left the EPL transposed.  Following this we irrigated copiously and turned attention towards the volar aspect of the hand.  An extensile carpal tunnel release was performed with care taken to not cross wrist creases at right angles.  Dissection was carried down and the patient underwent a carpal tunnel release.  Following this a flexor tenolysis Tina synovectomy was accomplished.  Following this I explored the volar floor and then had to perform a repair of the volar floor in the space of Poirier.  Patient tolerated this  well this was performed with FiberWire suture.  Patient tolerated this well this was a volar reconstruction of the ligamentous floor.  Following this final x-rays  were taken which looked excellent I was pleased with this.  Once this was complete we then irrigated copiously and all wounds were closed with the tourniquet deflated.  There were no complicating features.  Once this was complete we then placed him in a sugar tong cast/splint.  X-rays were taken during the operation and final x-rays of the elbow were taken also revealing excellent concentric reduction about the elbow.  I was pleased with this.  He was placed in a Carter arm pillow and attention was then turned towards the left wrist.  Prep and drape was accomplished with prescrub of Hibiclens followed by Betadine scrub and paint following this patient had an incision 1 cm made dissection was carried down to deep and Kirschner wires were removed and complex closure/layered closure was performed without difficulty.  X-rays looked excellent and quite stable here.  Short arm splint was applied.  There were no complicating features.  At the request of the patient I do feel comfortable sending him home tonight we will place him on Keflex for 14 days we will also place him on oxycodone Robaxin and Zofran.  We will see him back in the office in a week.  Standard [facility] protocol for the right and continue rehabilitative efforts for the left side.  All questions have been encouraged and answered.  Harol Shabazz MD

## 2019-09-22 NOTE — Discharge Instructions (Signed)

## 2019-09-22 NOTE — Anesthesia Procedure Notes (Signed)
Anesthesia Regional Block: Supraclavicular block   Pre-Anesthetic Checklist: ,, timeout performed, Correct Patient, Correct Site, Correct Laterality, Correct Procedure, Correct Position, site marked, Risks and benefits discussed,  Surgical consent,  Pre-op evaluation,  At surgeon's request and post-op pain management  Laterality: Right  Prep: chloraprep       Needles:  Injection technique: Single-shot  Needle Type: Stimulator Needle - 80          Additional Needles:   Procedures:, nerve stimulator,,,,,,,  Narrative:  Start time: 09/22/2019 3:45 PM End time: 09/22/2019 3:55 PM Injection made incrementally with aspirations every 5 mL.  Performed by: Personally   Additional Notes: 20 cc 0.75% Ropivacaine injected easily

## 2019-09-22 NOTE — Transfer of Care (Signed)
Immediate Anesthesia Transfer of Care Note  Patient: Robert Hahn  Procedure(s) Performed: Deep hardware removal left wrist, right wrist reconstruction of a dislocated lunate with proximal row carpectomy (Bilateral Arm Lower)  Patient Location: PACU  Anesthesia Type:GA combined with regional for post-op pain  Level of Consciousness: awake, alert  and oriented  Airway & Oxygen Therapy: Patient Spontanous Breathing and Patient connected to nasal cannula oxygen  Post-op Assessment: Report given to RN, Post -op Vital signs reviewed and stable and Patient moving all extremities  Post vital signs: Reviewed and stable  Last Vitals:  Vitals Value Taken Time  BP 153/75 09/22/19 1809  Temp 36.6 C 09/22/19 1809  Pulse 97 09/22/19 1816  Resp 23 09/22/19 1817  SpO2 99 % 09/22/19 1816  Vitals shown include unvalidated device data.  Last Pain:  Vitals:   09/22/19 1809  TempSrc:   PainSc: Asleep      Patients Stated Pain Goal: 3 (38/[id number])  Complications: No apparent anesthesia complications

## 2019-09-23 ENCOUNTER — Encounter (HOSPITAL_COMMUNITY): Payer: Self-pay | Admitting: Orthopedic Surgery

## 2019-09-24 NOTE — Anesthesia Postprocedure Evaluation (Signed)
Anesthesia Post Note  Patient: Robert Hahn  Procedure(s) Performed: Deep hardware removal left wrist, right wrist reconstruction of a dislocated lunate with proximal row carpectomy (Bilateral Arm Lower)     Patient location during evaluation: PACU Anesthesia Type: General Level of consciousness: awake and alert Pain management: pain level controlled Vital Signs Assessment: post-procedure vital signs reviewed and stable Respiratory status: spontaneous breathing, nonlabored ventilation, respiratory function stable and patient connected to nasal cannula oxygen Cardiovascular status: blood pressure returned to baseline and stable Postop Assessment: no apparent nausea or vomiting Anesthetic complications: no    Last Vitals:  Vitals:   09/22/19 1853 09/22/19 1857  BP: (!) 163/60   Pulse: 86 85  Resp: 19 17  Temp:  36.6 C  SpO2: 100% 100%    Last Pain:  Vitals:   09/22/19 1853  TempSrc:   PainSc: 0-No pain                 Rindi Beechy S

## 2019-10-20 ENCOUNTER — Encounter (HOSPITAL_COMMUNITY): Payer: Self-pay | Admitting: Orthopedic Surgery

## 2021-02-28 IMAGING — CT CT ABD-PELV W/ CM
2 of 5 series · 12 of 46 positions shown, 14 images · IV contrast (omnipaque)
Comparison: None.
COMPARISON: None.

Addendum:
CLINICAL DATA: Abd trauma, blunt, stable. Hit by tree branch. Fell
from tree.

EXAM:
CT CHEST, ABDOMEN, AND PELVIS WITH CONTRAST
TECHNIQUE: Multidetector CT imaging of the chest, abdomen and pelvis was
performed following the standard protocol during bolus
administration of intravenous contrast.
CONTRAST:  100mL OMNIPAQUE IOHEXOL 300 MG/ML  SOLN

[Series 3: cap with · axial · 0.93mm/px · z∈[+249,+844]mm · 9 of 147 slices shown, 11 images]
[im 14/147  soft-tissue]
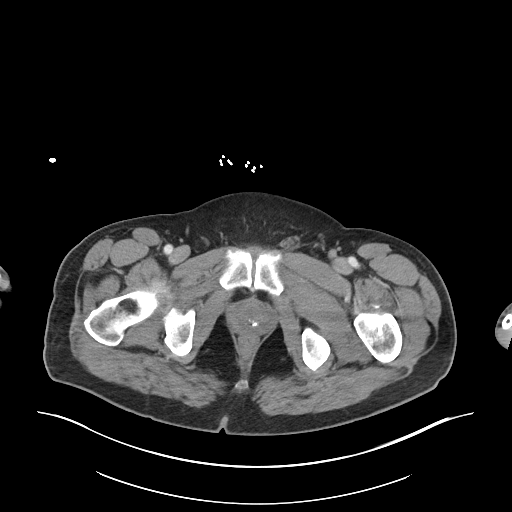
[im 14/147  bone]
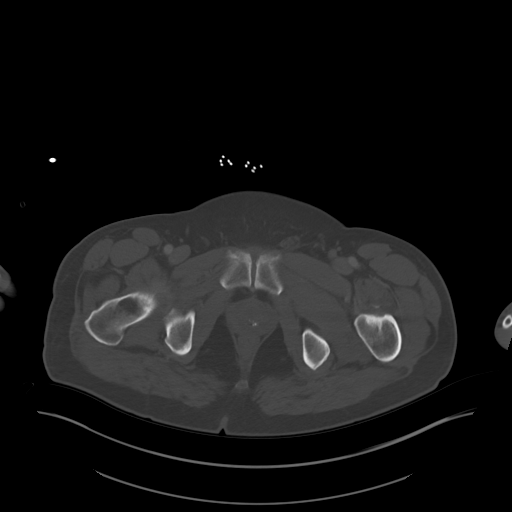
[im 27/147  soft-tissue]
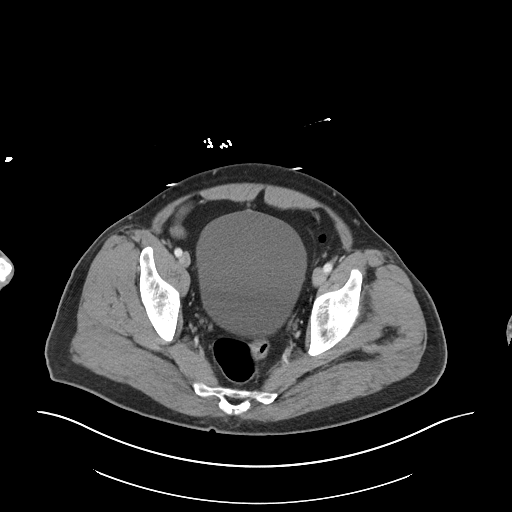
[im 40/147  soft-tissue]
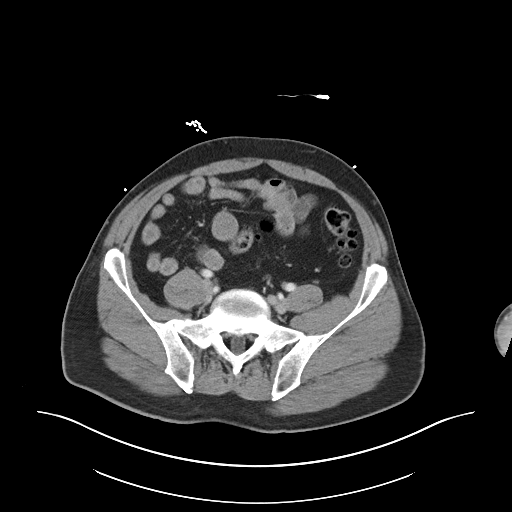
[im 54/147  soft-tissue]
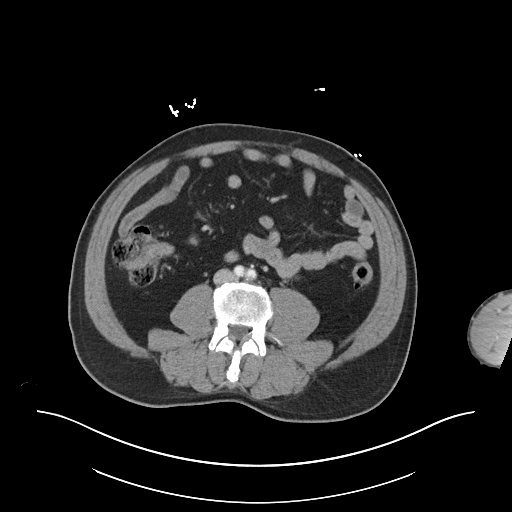
[im 80/147  soft-tissue]
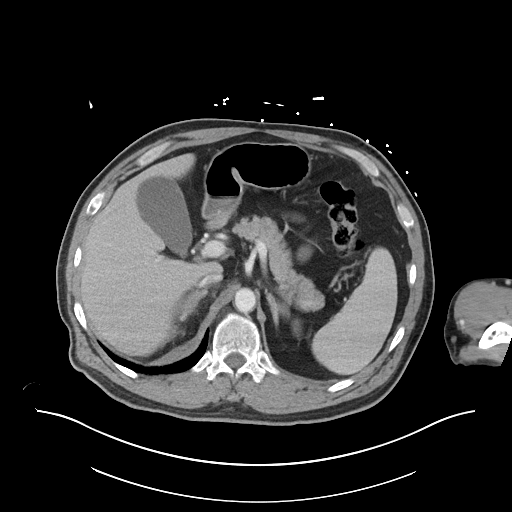
[im 93/147  soft-tissue]
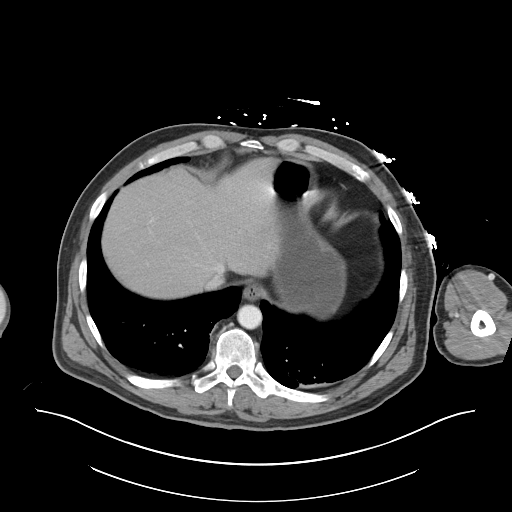
[im 107/147  soft-tissue]
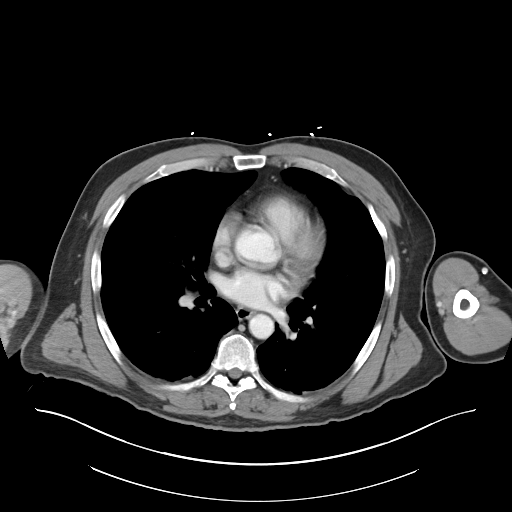
[im 120/147  soft-tissue]
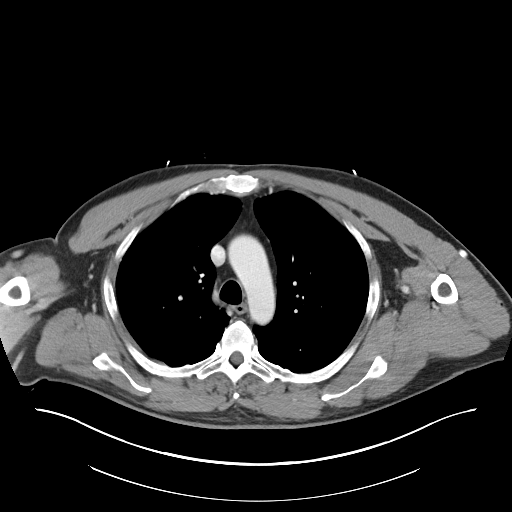
[im 133/147  soft-tissue]
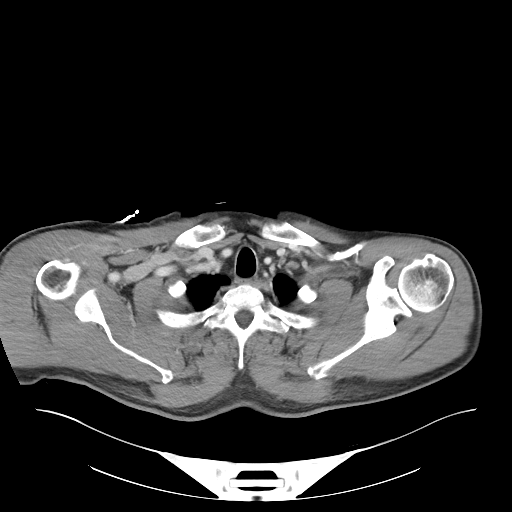
[im 133/147  bone]
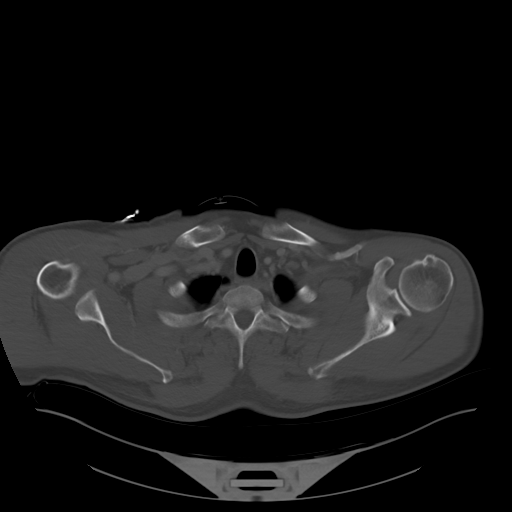

[Series 6: cor · coronal · 0.97mm/px · 3 of 100 slices shown]
[im 34/100  soft-tissue]
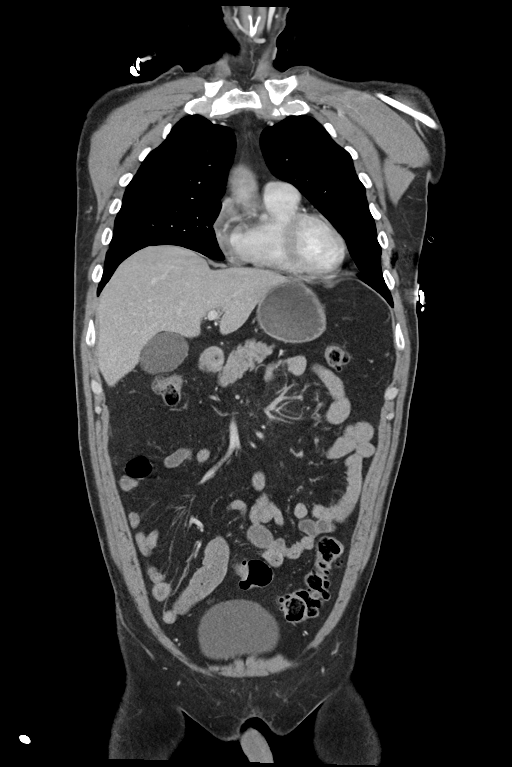
[im 45/100  soft-tissue]
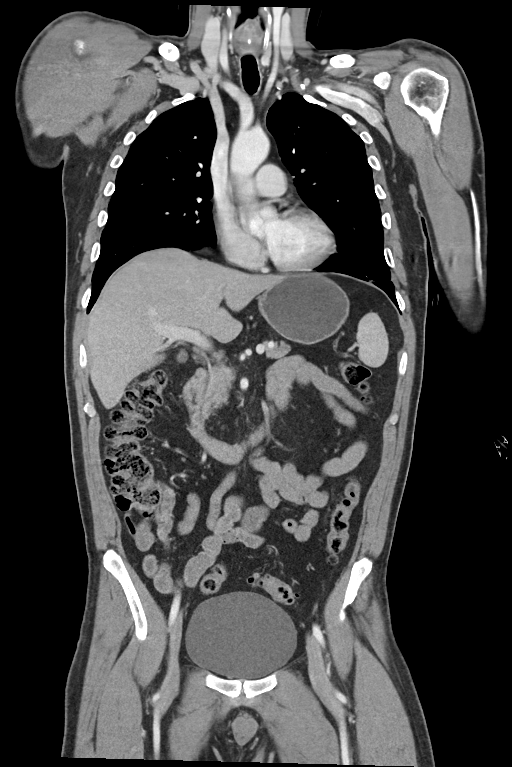
[im 56/100  soft-tissue]
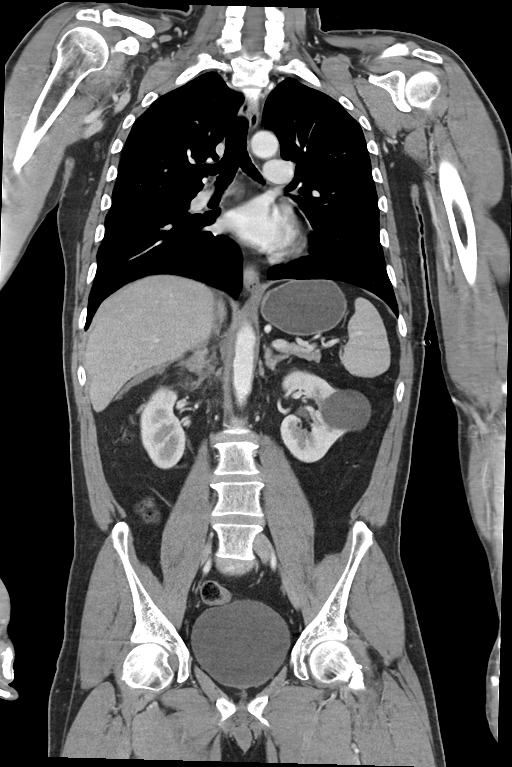

[12 of 46 positions shown; findings below may reference images not displayed]

FINDINGS: CT CHEST FINDINGS

Cardiovascular: Heart size is normal. In aorta and great vessel
origins are within normal limits. Pulmonary arteries are normal.
There is no significant pericardial effusion or trauma.

Mediastinum/Nodes: Significant mediastinal, hilar, or axillary
adenopathy is present. There is no significant mediastinal effusion
or air.

Lungs/Pleura: Minimal dependent atelectasis is present. Lungs are
otherwise clear without focal nodule, mass, or airspace disease. Is
no pulmonary contusion. No pneumothorax is present. No significant
pleural effusions are present.

Musculoskeletal: 12 rib-bearing thoracic type vertebral bodies are
present. No acute or healing fractures are present. Ribs are
unremarkable.

CT ABDOMEN PELVIS FINDINGS

Hepatobiliary: Hemorrhage is present along the posterior margin of
the liver. No focal hepatic contusion is present. The common bile
duct and gallbladder normal.

Pancreas: Unremarkable. No pancreatic ductal dilatation or
surrounding inflammatory changes.

Spleen: No splenic injury or perisplenic hematoma.

Adrenals/Urinary Tract: There is diffuse hemorrhage about the right
adrenal gland. Focal collection is present. Left adrenal gland is
normal. Simple cyst in the left kidney measures 5.3 x 4.1 x 4.3 cm.
Kidneys and ureters are otherwise within normal limits bilaterally.
The urinary bladder is within normal limits.

Stomach/Bowel: Stomach and duodenum are within normal limits. Small
bowel is unremarkable. Terminal ileum is normal. The appendix is
visualized and normal. The ascending and transverse colon are
normal. Descending and sigmoid colon are within normal limits.

Vascular/Lymphatic: Scattered atherosclerotic calcifications are
present. There is no aneurysm. No significant retroperitoneal
adenopathy is present.

Reproductive: Prostate is unremarkable.

Other: No abdominal wall hernia or abnormality. No abdominopelvic
ascites.

Musculoskeletal: Left transverse process fractures are present at
L1, L2, and L3. Fracture fragments are displaced posteriorly 1 shaft
width. Vertebral body heights are maintained. No additional
fractures present in the spine. Sacrum is intact. A comminuted left
femoral neck fracture is present. Varus angulation is noted. Right
hip is located. Left acetabulum is intact.
IMPRESSION: 1. Right adrenal hemorrhage.
2. Hemorrhage extending into Morrison's pouch and about the free
edge of the liver posteriorly. No definite hemorrhagic contusion is
present.
3. Left L1, L2, and L3 transverse process fractures
4. Comminuted left femoral neck fracture.

ADDENDUM:
A minimally displaced proximal right first rib fracture is present.
No additional rib fractures are present.

*** End of Addendum ***
FINDINGS: CT CHEST FINDINGS

Cardiovascular: Heart size is normal. In aorta and great vessel
origins are within normal limits. Pulmonary arteries are normal.
There is no significant pericardial effusion or trauma.

Mediastinum/Nodes: Significant mediastinal, hilar, or axillary
adenopathy is present. There is no significant mediastinal effusion
or air.

Lungs/Pleura: Minimal dependent atelectasis is present. Lungs are
otherwise clear without focal nodule, mass, or airspace disease. Is
no pulmonary contusion. No pneumothorax is present. No significant
pleural effusions are present.

Musculoskeletal: 12 rib-bearing thoracic type vertebral bodies are
present. No acute or healing fractures are present. Ribs are
unremarkable.

CT ABDOMEN PELVIS FINDINGS

Hepatobiliary: Hemorrhage is present along the posterior margin of
the liver. No focal hepatic contusion is present. The common bile
duct and gallbladder normal.

Pancreas: Unremarkable. No pancreatic ductal dilatation or
surrounding inflammatory changes.

Spleen: No splenic injury or perisplenic hematoma.

Adrenals/Urinary Tract: There is diffuse hemorrhage about the right
adrenal gland. Focal collection is present. Left adrenal gland is
normal. Simple cyst in the left kidney measures 5.3 x 4.1 x 4.3 cm.
Kidneys and ureters are otherwise within normal limits bilaterally.
The urinary bladder is within normal limits.

Stomach/Bowel: Stomach and duodenum are within normal limits. Small
bowel is unremarkable. Terminal ileum is normal. The appendix is
visualized and normal. The ascending and transverse colon are
normal. Descending and sigmoid colon are within normal limits.

Vascular/Lymphatic: Scattered atherosclerotic calcifications are
present. There is no aneurysm. No significant retroperitoneal
adenopathy is present.

Reproductive: Prostate is unremarkable.

Other: No abdominal wall hernia or abnormality. No abdominopelvic
ascites.

Musculoskeletal: Left transverse process fractures are present at
L1, L2, and L3. Fracture fragments are displaced posteriorly 1 shaft
width. Vertebral body heights are maintained. No additional
fractures present in the spine. Sacrum is intact. A comminuted left
femoral neck fracture is present. Varus angulation is noted. Right
hip is located. Left acetabulum is intact.
IMPRESSION: 1. Right adrenal hemorrhage.
2. Hemorrhage extending into Morrison's pouch and about the free
edge of the liver posteriorly. No definite hemorrhagic contusion is
present.
3. Left L1, L2, and L3 transverse process fractures
4. Comminuted left femoral neck fracture.

## 2021-03-02 IMAGING — DX DG CHEST 1V PORT
1 series · 1 of 1 positions shown · non-contrast
Comparison: CT chest 07/26/2019

CLINICAL DATA: Pleural effusion status post fall

EXAM:
PORTABLE CHEST 1 VIEW

[chest ap]
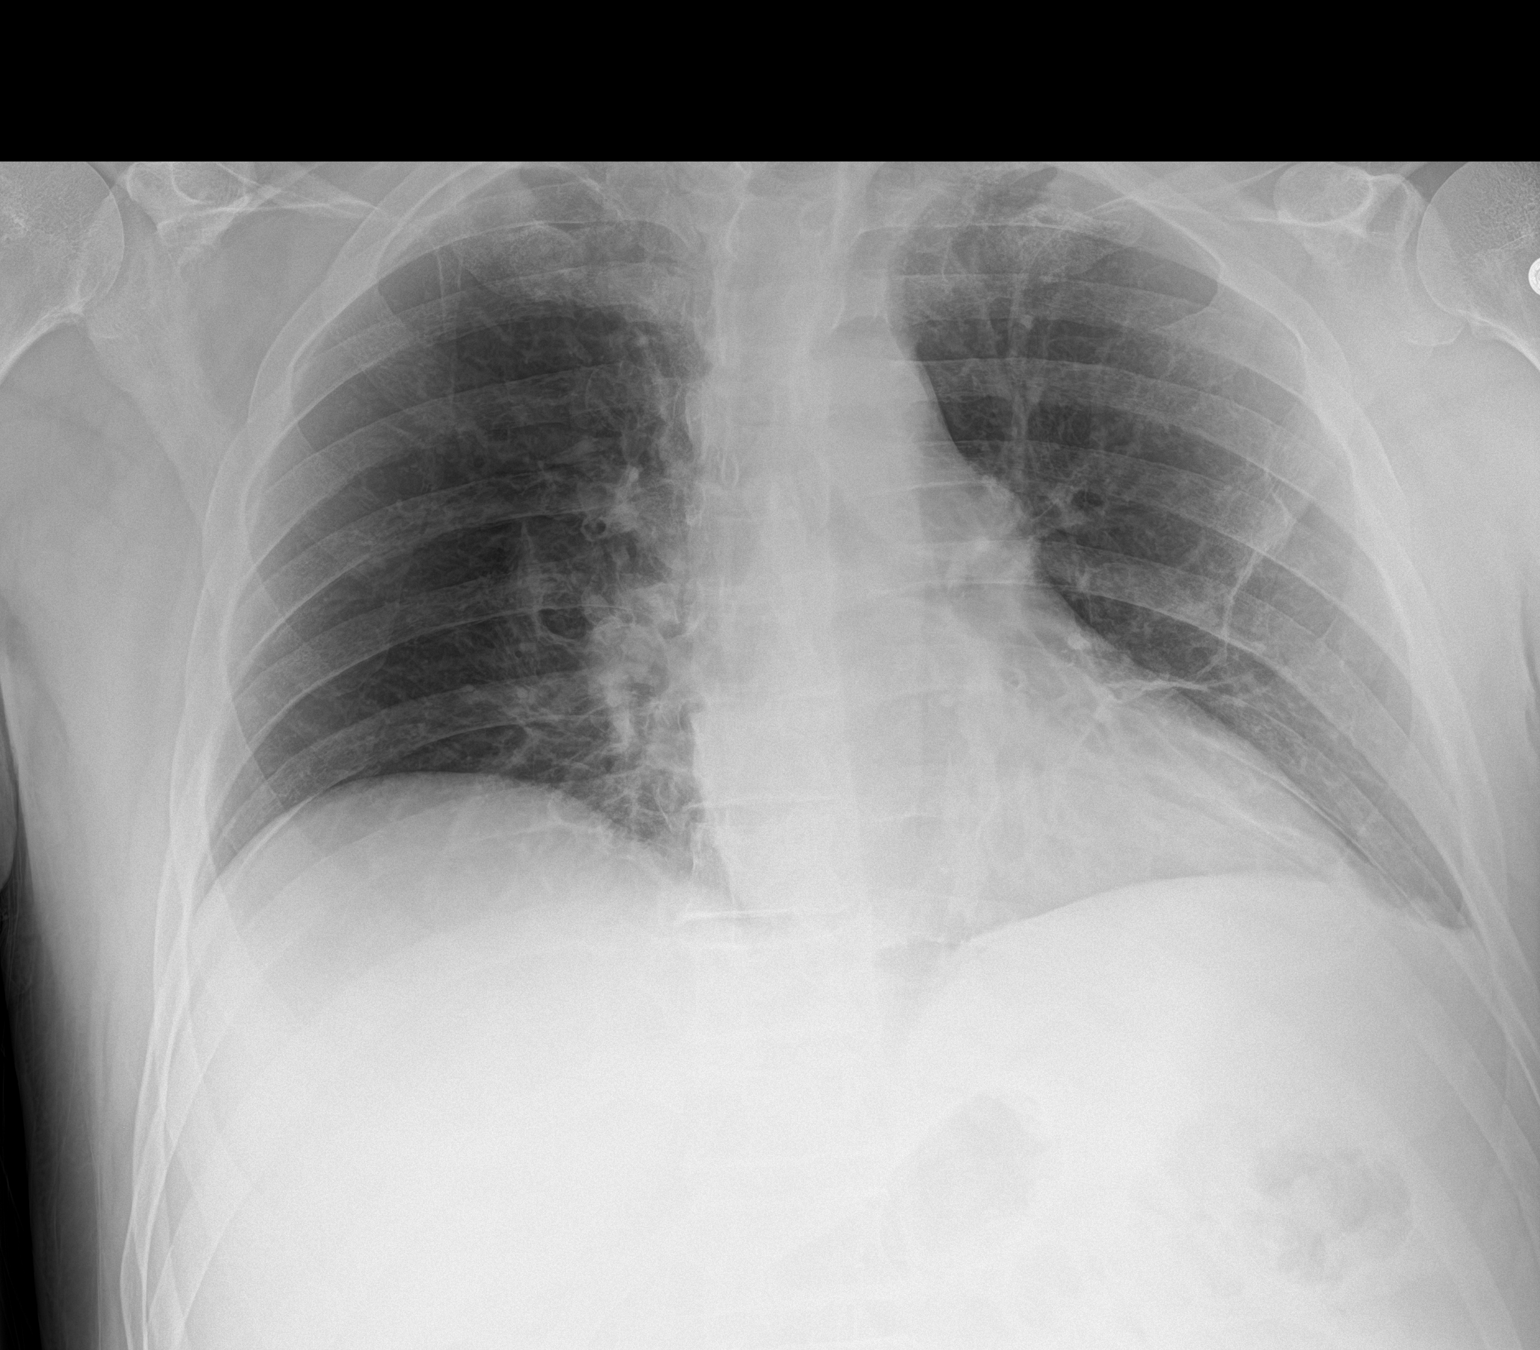

[1 of 1 positions shown; findings below may reference images not displayed]

FINDINGS: There is mild left basilar atelectasis. There is no focal
consolidation. There is no pleural effusion or pneumothorax. The
heart and mediastinal contours are unremarkable.

The nondisplaced fracture at the tip of left twelfth rib is not well
visualized on the current exam. There is otherwise no acute osseous
abnormality.
IMPRESSION: Mild left basilar atelectasis.
